# Patient Record
Sex: Male | Born: 1960 | Race: White | Hispanic: No | Marital: Married | Smoking: Never smoker
Health system: Southern US, Community
[De-identification: ages and names within clinical notes are randomized; demographics above are authoritative.]

## PROBLEM LIST (undated history)

## (undated) DIAGNOSIS — Z7901 Long term (current) use of anticoagulants: Secondary | ICD-10-CM

## (undated) DIAGNOSIS — G4733 Obstructive sleep apnea (adult) (pediatric): Secondary | ICD-10-CM

## (undated) DIAGNOSIS — I4891 Unspecified atrial fibrillation: Secondary | ICD-10-CM

## (undated) DIAGNOSIS — I2510 Atherosclerotic heart disease of native coronary artery without angina pectoris: Secondary | ICD-10-CM

## (undated) DIAGNOSIS — Z9989 Dependence on other enabling machines and devices: Secondary | ICD-10-CM

## (undated) DIAGNOSIS — I10 Essential (primary) hypertension: Secondary | ICD-10-CM

## (undated) DIAGNOSIS — M109 Gout, unspecified: Secondary | ICD-10-CM

## (undated) DIAGNOSIS — E785 Hyperlipidemia, unspecified: Secondary | ICD-10-CM

## (undated) DIAGNOSIS — Z8249 Family history of ischemic heart disease and other diseases of the circulatory system: Secondary | ICD-10-CM

## (undated) DIAGNOSIS — K219 Gastro-esophageal reflux disease without esophagitis: Secondary | ICD-10-CM

## (undated) HISTORY — DX: Morbid (severe) obesity due to excess calories: E66.01

## (undated) HISTORY — DX: Hyperlipidemia, unspecified: E78.5

## (undated) HISTORY — DX: Atherosclerotic heart disease of native coronary artery without angina pectoris: I25.10

## (undated) HISTORY — DX: Essential (primary) hypertension: I10

## (undated) HISTORY — DX: Dependence on other enabling machines and devices: Z99.89

## (undated) HISTORY — DX: Unspecified atrial fibrillation: I48.91

## (undated) HISTORY — DX: Gastro-esophageal reflux disease without esophagitis: K21.9

## (undated) HISTORY — DX: Family history of ischemic heart disease and other diseases of the circulatory system: Z82.49

## (undated) HISTORY — DX: Obstructive sleep apnea (adult) (pediatric): G47.33

## (undated) HISTORY — DX: Long term (current) use of anticoagulants: Z79.01

## (undated) HISTORY — DX: Gout, unspecified: M10.9

---

## 1988-09-03 HISTORY — PX: SEPTOPLASTY: SUR1290

## 2013-09-03 HISTORY — PX: TONSILLECTOMY: SUR1361

## 2016-09-03 DIAGNOSIS — Z87442 Personal history of urinary calculi: Secondary | ICD-10-CM

## 2016-09-03 HISTORY — PX: CYSTOSCOPY WITH HOLMIUM LASER LITHOTRIPSY: SHX6639

## 2016-09-03 HISTORY — DX: Personal history of urinary calculi: Z87.442

## 2018-06-30 HISTORY — PX: CARDIOVERSION: SHX1299

## 2018-08-04 NOTE — Progress Notes (Addendum)
Electrophysiology Office Note   Date:  08/05/2018   ID:  Austin Barker, DOB 06-Jun-1961, MRN [id number]  PCP:  Dr Katharina Caperraig Petry  Cardiologist:  Dr Georgana CurioJunpaparp Primary Electrophysiologist: Dr Elberta Fortisamnitz   CC: Evaluation of atrial fibrillation   History of Present Illness: Austin Barker is a [age] y.o. male with a history of OSA, HTN, hyperlipidemia, and obesity who is being seen today for the evaluation of paroxsymal atrial fibrillation referred by Dr. Collier BullockJundaparp. . Patient was first diagnosed with atrial fibrillation in September of this year at his PCP office. He was symptomatic with severe fatigue and exertional dyspnea. He was started on amiodarone and Xarelto and underwent DCCV on 06/19/18 which was successful. He continues to have paroxysms of afib and had another DCCV on 06/30/18. He stayed in normal rhythm for about a month and is now back in atrial fibrillation and has been for over a week. Patient has been on Xarelto since his diagnosis.   Today, he denies symptoms of palpitations, chest pain, orthopnea, PND, lower extremity edema, claudication, dizziness, presyncope, syncope, bleeding, or neurologic sequela. The patient is tolerating medications without difficulties.    Past Medical History:  Diagnosis Date  . A-fib (HCC)       . Dyslipidemia   . Essential hypertension, benign   . Family history of abdominal aortic aneurysm (AAA)   . FH: coronary artery disease   . GERD (gastroesophageal reflux disease)   . Gout, unspecified   . Long term (current) use of anticoagulants   . Morbid obesity (HCC)   . OSA on CPAP    Past Surgical History:  Procedure Laterality Date  . CARDIOVERSION  06/30/2018     Current Outpatient Medications  Medication Sig Dispense Refill  . amiodarone (PACERONE) 200 MG tablet Take 200 mg by mouth daily.    Marland Kitchen. atenolol (TENORMIN) 50 MG tablet Take 50 mg by mouth daily.    Marland Kitchen. atorvastatin (LIPITOR) 20 MG tablet Take 20 mg by mouth daily.    .  benazepril (LOTENSIN) 40 MG tablet Take 40 mg by mouth daily.    . cetirizine (ZYRTEC) 10 MG tablet Take 10 mg by mouth daily.    . Febuxostat (ULORIC) 80 MG TABS Take 80 mg by mouth daily.     . fenofibrate 160 MG tablet Take 160 mg by mouth daily.    . hydrochlorothiazide (HYDRODIURIL) 25 MG tablet Take 25 mg by mouth daily.    . Multiple Vitamins-Minerals (CENTRUM SILVER 50+MEN) TABS Take by mouth daily.     . Omega-3 Fatty Acids (FISH OIL) 1000 MG CAPS Take 2,000 mg by mouth daily.     Marland Kitchen. omeprazole (PRILOSEC) 20 MG capsule Take 20 mg by mouth daily.    . rivaroxaban (XARELTO) 20 MG TABS tablet Take 20 mg by mouth daily with supper.     No current facility-administered medications for this visit.     Allergies:   Oxycodone   Social History:  The patient  reports that he has never smoked. He has never used smokeless tobacco.   Family History:  The patient's family history includes Diabetes in his father; Heart attack (age of onset: 2740) in his brother.    ROS:  Please see the history of present illness.   Otherwise, review of systems is positive for shortness of breath and fatigue.   All other systems are reviewed and negative.    PHYSICAL EXAM: VS:  BP 110/76   Pulse 90   Ht 6'  1" (1.854 m)   Wt (!) 314 lb 6.4 oz (142.6 kg)   SpO2 99%   BMI 41.48 kg/m  , BMI Body mass index is 41.48 kg/m. GEN: Well nourished, well developed obese male in no acute distress  HEENT: normal  Neck: no JVD, carotid bruits, or masses Cardiac: irregularly irregular; no murmurs, rubs, or gallops,no edema  Respiratory:  clear to auscultation bilaterally, normal work of breathing GI: soft, nontender, nondistended, + BS MS: no deformity or atrophy  Skin: warm and dry Neuro:  Strength and sensation are intact Psych: euthymic mood, full affect  EKG:  EKG is ordered today. Personal review of the ekg ordered shows atrial fibrillation HR 90, QRS 90, QTc 459   Recent Labs: No results found for  requested labs within last 8760 hours.    Lipid Panel  No results found for: CHOL, TRIG, HDL, CHOLHDL, VLDL, LDLCALC, LDLDIRECT   Wt Readings from Last 3 Encounters:  08/05/18 (!) 314 lb 6.4 oz (142.6 kg)      Other studies Reviewed: Additional studies/ records that were reviewed today include: [facility] notes, DCCV report 06/30/18. Echo 06/16/18 Review of the above records today demonstrates:  Echo-LVEF 55-60%, mild LVH, mild MR, mild aortic root dilation (43mm), LA normal size.   ASSESSMENT AND PLAN:  1.  Persistant atrial fibrillation New onset 9/19. S/p DCCV x2 Patient has been on amiodarone 200mg  daily and atenolol 50mg  daily. He has failed AA therapy with amiodarone.   Chads2vasc score is 1 he is anticoagulated with xarelto 20mg  daily . Therapeutic strategies for afib including medicine and ablation were discussed in detail with the patient today. Risk, benefits, and alternatives to EP study and radiofrequency ablation for afib were also discussed in detail today. These risks include but are not limited to stroke, bleeding, vascular damage, tamponade, perforation, damage to the esophagus, lungs, and other structures, pulmonary vein stenosis, worsening renal function, and death. The patient understands these risk and wishes to proceed.  We Rashea Hoskie therefore proceed with catheter ablation at the next available time.  Continue amiodarone and atenolol for now.  2. OSA Patient reports good compliance with CPAP. He is due to have his machine checked.  3. HTN Stable, no changes today  4.  Morbid obesity Body mass index is 41.48 kg/m. Lifestyle modifications discussed. Diet and exercise recommended.   Current medicines are reviewed at length with the patient today.   The patient does not have concerns regarding his medicines.  The following changes were made today:  none  Labs/ tests ordered today include: Orders Placed This Encounter  Procedures  . CT  CARDIAC MORPH/PULM VEIN W/CM&W/O CA SCORE  . CT CORONARY FRACTIONAL FLOW RESERVE DATA PREP  . CT CORONARY FRACTIONAL FLOW RESERVE FLUID ANALYSIS  . EKG 12-Lead     Disposition:   Claxton Levitz schedule FU after his ablation procedure.  Signed, Vayla Wilhelmi Jorja Loa, MD  08/05/2018 10:20 AM     [facility] [address] [place] [address] [phone] (office) [phone] (fax)  I have seen and examined this patient with Jorja Loa.  Agree with above, note added to reflect my findings.  On exam, iRRR, no murmurs, lungs clear.  Patient with persistent atrial fibrillation.  Is currently on amiodarone and has had cardioversion x2 with early return of atrial fibrillation.  At this point he is failing medical therapy.  We Kaiya Boatman plan for ablation.  Risks and benefits were discussed and include bleeding, tamponade, heart block,  stroke, damage to surrounding organs.  He understands these risks and is agreed to the procedure.  I did encourage him to be compliant with his CPAP.  He also has morbid obesity.  Encouraged weight loss and exercise.  Case discussed with referring cardiologist  Damesha Lawler M. Marykathryn Carboni MD 08/05/2018 10:20 AM

## 2018-08-05 ENCOUNTER — Encounter: Payer: Self-pay | Admitting: Cardiology

## 2018-08-05 ENCOUNTER — Ambulatory Visit: Payer: BLUE CROSS/BLUE SHIELD | Admitting: Cardiology

## 2018-08-05 VITALS — BP 110/76 | HR 90 | Ht 73.0 in | Wt 314.4 lb

## 2018-08-05 DIAGNOSIS — I4819 Other persistent atrial fibrillation: Secondary | ICD-10-CM

## 2018-08-05 DIAGNOSIS — I2510 Atherosclerotic heart disease of native coronary artery without angina pectoris: Secondary | ICD-10-CM

## 2018-08-05 DIAGNOSIS — I10 Essential (primary) hypertension: Secondary | ICD-10-CM | POA: Diagnosis not present

## 2018-08-05 DIAGNOSIS — G4733 Obstructive sleep apnea (adult) (pediatric): Secondary | ICD-10-CM | POA: Diagnosis not present

## 2018-08-05 NOTE — Patient Instructions (Addendum)
Medication Instructions:  Your physician recommends that you continue on your current medications as directed. Please refer to the Current Medication list given to you today.  *If you need a refill on your cardiac medications before your next appointment, please call your pharmacy.  Labwork: We will arrange for you to have lab work at your primary doctor in [place].  Testing/Procedures: Your physician has requested that you have cardiac CT within 7 days PRIOR to your ablation. Cardiac computed tomography (CT) is a painless test that uses an x-ray machine to take clear, detailed pictures of your heart. For further information please visit [email or web address] Please follow instruction below located under special instructions.  Your physician has recommended that you have an ablation. Catheter ablation is a medical procedure used to treat some cardiac arrhythmias (irregular heartbeats). During catheter ablation, a long, thin, flexible tube is put into a blood vessel in your groin (upper thigh), or neck. This tube is called an ablation catheter. It is then guided to your heart through the blood vessel. Radio frequency waves destroy small areas of heart tissue where abnormal heartbeats may cause an arrhythmia to start. Please see the instructions below located under special instructions  Follow-Up: Your physician recommends that you schedule a follow-up appointment in: 4 weeks, after your procedure on 09/12/2018, with Rudi Coco NP in the AFib clinic.  Your physician recommends that you schedule a follow up appointment in: 3 months, after your procedure on 09/12/2018, with Dr. Elberta Fortis.  *Please note that any paperwork needing to be filled out by the provider will need to be addressed at the front desk prior to seeing the provider. Please note that any FMLA, disability or other documents regarding health condition is subject to a $25.00 charge that must be received prior to completion of paperwork in  the form of a money order or check.  Thank you for choosing [facility]!! [phone]   Any Other Special Instructions Will Be Listed Below    CT INSTRUCTIONS Please arrive at the [facility] main entrance of [facility] at _____ AM (30-45 minutes prior to test start time)  [facility] [address] [place], [place] [address] [phone]  Proceed to the [facility] Radiology Department (First Floor).  Please follow these instructions carefully (unless otherwise directed):  Hold all erectile dysfunction medications at least 48 hours prior to test.  On the Night Before the Test: . Be sure to Drink plenty of water. . Do not consume any caffeinated/decaffeinated beverages or chocolate 12 hours prior to your test. . Do not take any antihistamines 12 hours prior to your test.  On the Day of the Test: . Drink plenty of water. Do not drink any water within one hour of the test. . Do not eat any food 4 hours prior to the test. . You may take your regular medications prior to the test.  . Take your ATENOLOL two hours prior to test. . HOLD Hydrochlorothiazide the morning of the test.      After the Test: . Drink plenty of water. . After receiving IV contrast, you may experience a mild flushed feeling. This is normal. . On occasion, you may experience a mild rash up to 24 hours after the test. This is not dangerous. If this occurs, you can take Benadryl 25 mg and increase your fluid intake. . If you experience trouble breathing, this can be serious. If it is severe call 911 IMMEDIATELY. If it is mild, please call  our office.    Instructions for your ablation: 1. Please arrive [facility], Main Entrance "A", [facility] at 8:30 A.M. on 09/12/2018. 2. Do not eat or drink after midnight the night prior to the procedure. 3. Do not miss any doses of XARELTO prior to the morning of the procedure.  4. Do not take any medications the morning of  the procedure. 5. Plan for an overnight stay in the hospital. 6. You will need someone to drive you home at discharge.   Cardiac Ablation Cardiac ablation is a procedure to disable (ablate) a small amount of heart tissue in very specific places. The heart has many electrical connections. Sometimes these connections are abnormal and can cause the heart to beat very fast or irregularly. Ablating some of the problem areas can improve the heart rhythm or return it to normal. Ablation may be done for people who:  Have Wolff-Parkinson-White syndrome.  Have fast heart rhythms (tachycardia).  Have taken medicines for an abnormal heart rhythm (arrhythmia) that were not effective or caused side effects.  Have a high-risk heartbeat that may be life-threatening.  During the procedure, a small incision is made in the neck or the groin, and a long, thin, flexible tube (catheter) is inserted into the incision and moved to the heart. Small devices (electrodes) on the tip of the catheter will send out electrical currents. A type of X-ray (fluoroscopy) will be used to help guide the catheter and to provide images of the heart. Tell a health care provider about:  Any allergies you have.  All medicines you are taking, including vitamins, herbs, eye drops, creams, and over-the-counter medicines.  Any problems you or family members have had with anesthetic medicines.  Any blood disorders you have.  Any surgeries you have had.  Any medical conditions you have, such as kidney failure.  Whether you are pregnant or may be pregnant. What are the risks? Generally, this is a safe procedure. However, problems may occur, including:  Infection.  Bruising and bleeding at the catheter insertion site.  Bleeding into the chest, especially into the sac that surrounds the heart. This is a serious complication.  Stroke or blood clots.  Damage to other structures or organs.  Allergic reaction to medicines or  dyes.  Need for a permanent pacemaker if the normal electrical system is damaged. A pacemaker is a small computer that sends electrical signals to the heart and helps your heart beat normally.  The procedure not being fully effective. This may not be recognized until months later. Repeat ablation procedures are sometimes required.  What happens before the procedure?  Follow instructions from your health care provider about eating or drinking restrictions.  Ask your health care provider about: ? Changing or stopping your regular medicines. This is especially important if you are taking diabetes medicines or blood thinners. ? Taking medicines such as aspirin and ibuprofen. These medicines can thin your blood. Do not take these medicines before your procedure if your health care provider instructs you not to.  Plan to have someone take you home from the hospital or clinic.  If you will be going home right after the procedure, plan to have someone with you for 24 hours. What happens during the procedure?  To lower your risk of infection: ? Your health care team will wash or sanitize their hands. ? Your skin will be washed with soap. ? Hair may be removed from the incision area.  An IV tube  will be inserted into one of your veins.  You will be given a medicine to help you relax (sedative).  The skin on your neck or groin will be numbed.  An incision will be made in your neck or your groin.  A needle will be inserted through the incision and into a large vein in your neck or groin.  A catheter will be inserted into the needle and moved to your heart.  Dye may be injected through the catheter to help your surgeon see the area of the heart that needs treatment.  Electrical currents will be sent from the catheter to ablate heart tissue in desired areas. There are three types of energy that may be used to ablate heart tissue: ? Heat (radiofrequency energy). ? Laser energy. ? Extreme  cold (cryoablation).  When the necessary tissue has been ablated, the catheter will be removed.  Pressure will be held on the catheter insertion area to prevent excessive bleeding.  A bandage (dressing) will be placed over the catheter insertion area. The procedure may vary among health care providers and hospitals. What happens after the procedure?  Your blood pressure, heart rate, breathing rate, and blood oxygen level will be monitored until the medicines you were given have worn off.  Your catheter insertion area will be monitored for bleeding. You will need to lie still for a few hours to ensure that you do not bleed from the catheter insertion area.  Do not drive for 24 hours or as long as directed by your health care provider. Summary  Cardiac ablation is a procedure to disable (ablate) a small amount of heart tissue in very specific places. Ablating some of the problem areas can improve the heart rhythm or return it to normal.  During the procedure, electrical currents will be sent from the catheter to ablate heart tissue in desired areas. This information is not intended to replace advice given to you by your health care provider. Make sure you discuss any questions you have with your health care provider. Document Released: 01/06/2009 Document Revised: 07/09/2016 Document Reviewed: 07/09/2016 Elsevier Interactive Patient Education  [employer].

## 2018-08-05 NOTE — Addendum Note (Signed)
Addended by: Dory HornPRICE, Haley Roza L on: 08/05/2018 10:00 AM   Modules accepted: Orders

## 2018-08-05 NOTE — Progress Notes (Signed)
Completed OV faxed to both [place]CP and [place] in [place].   (Dr. Theodosia [facility]alingPetry at [facility])  (Dr. Durel SaltsValentine at [facility])

## 2018-09-04 ENCOUNTER — Telehealth (HOSPITAL_COMMUNITY): Payer: Self-pay | Admitting: Emergency Medicine

## 2018-09-04 NOTE — Telephone Encounter (Signed)
Reaching out to patient to offer assistance regarding upcoming cardiac imaging study; pt verbalizes understanding of appt date/time, parking situation and where to check in, pre-test NPO status and medications ordered, and verified current allergies; name and call back number provided for further questions should they arise Magda Muise RN Navigator Cardiac Imaging [phone] 

## 2018-09-08 ENCOUNTER — Ambulatory Visit (HOSPITAL_COMMUNITY): Admission: RE | Admit: 2018-09-08 | Payer: BLUE CROSS/BLUE SHIELD | Source: Ambulatory Visit

## 2018-09-08 ENCOUNTER — Encounter (HOSPITAL_COMMUNITY): Payer: Self-pay

## 2018-09-08 ENCOUNTER — Ambulatory Visit (HOSPITAL_COMMUNITY)
Admission: RE | Admit: 2018-09-08 | Discharge: 2018-09-08 | Disposition: A | Discharge status: Home / Self Care | Payer: BLUE CROSS/BLUE SHIELD | Source: Ambulatory Visit | Attending: Cardiology | Admitting: Cardiology

## 2018-09-08 DIAGNOSIS — I4819 Other persistent atrial fibrillation: Secondary | ICD-10-CM

## 2018-09-08 MED ORDER — IOPAMIDOL (ISOVUE-370) INJECTION 76%
80.0000 mL | Freq: Once | INTRAVENOUS | Status: AC | PRN
  Administered 2018-09-08: 100 mL via INTRAVENOUS

## 2018-09-10 ENCOUNTER — Telehealth: Payer: Self-pay | Admitting: Cardiology

## 2018-09-10 DIAGNOSIS — R931 Abnormal findings on diagnostic imaging of heart and coronary circulation: Secondary | ICD-10-CM

## 2018-09-10 NOTE — Telephone Encounter (Signed)
Informed patient of results and verbal understanding expressed.  Pt understands office will contact him to arrange myoview testing.

## 2018-09-10 NOTE — Telephone Encounter (Signed)
-----   Message from Will Jorja Loa, MD sent at 09/09/2018 12:18 PM EST ----- Will need myoview to assess CAD with high calcium score seen on CT. Can be done post ablation. CT with out LAA thrombus reviewed.

## 2018-09-10 NOTE — Telephone Encounter (Signed)
New Message    Patient had CT done on 09/08/18 and wants the results, because he's having an ablation on Friday.

## 2018-09-11 NOTE — Anesthesia Preprocedure Evaluation (Addendum)
Anesthesia Evaluation  Patient identified by MRN, date of birth, ID band Patient awake    Reviewed: Allergy & Precautions, NPO status , Patient's Chart, lab work & pertinent test results, reviewed documented beta blocker date and time   History of Anesthesia Complications Negative for: history of anesthetic complications  Airway Mallampati: II  TM Distance: >3 FB Neck ROM: Full    Dental  (+) Dental Advisory Given   Pulmonary sleep apnea and Continuous Positive Airway Pressure Ventilation ,    breath sounds clear to auscultation       Cardiovascular hypertension, Pt. on medications and Pt. on home beta blockers (-) angina+ dysrhythmias Atrial Fibrillation  Rhythm:Regular Rate:Normal  10/19 ECHO: EF 55-60%, mild MR   Neuro/Psych negative neurological ROS     GI/Hepatic Neg liver ROS, GERD  Controlled,  Endo/Other  Morbid obesity  Renal/GU negative Renal ROS     Musculoskeletal   Abdominal (+) + obese,   Peds  Hematology xarelto   Anesthesia Other Findings   Reproductive/Obstetrics                            Anesthesia Physical Anesthesia Plan  ASA: III  Anesthesia Plan: General   Post-op Pain Management:    Induction: Intravenous  PONV Risk Score and Plan: 2 and Ondansetron and Dexamethasone  Airway Management Planned: Oral ETT  Additional Equipment:   Intra-op Plan:   Post-operative Plan: Extubation in OR  Informed Consent: I have reviewed the patients History and Physical, chart, labs and discussed the procedure including the risks, benefits and alternatives for the proposed anesthesia with the patient or authorized representative who has indicated his/her understanding and acceptance.   Dental advisory given  Plan Discussed with: CRNA and Surgeon  Anesthesia Plan Comments: (Plan routine monitors, GETA)        Anesthesia Quick Evaluation

## 2018-09-12 ENCOUNTER — Other Ambulatory Visit: Payer: Self-pay

## 2018-09-12 ENCOUNTER — Encounter (HOSPITAL_COMMUNITY)
Admission: RE | Disposition: A | Discharge status: Home / Self Care | Payer: Self-pay | Source: Home / Self Care | Attending: Cardiology

## 2018-09-12 ENCOUNTER — Ambulatory Visit (HOSPITAL_COMMUNITY): Payer: BLUE CROSS/BLUE SHIELD | Admitting: Certified Registered Nurse Anesthetist

## 2018-09-12 ENCOUNTER — Ambulatory Visit (HOSPITAL_COMMUNITY)
Admission: RE | Admit: 2018-09-12 | Discharge: 2018-09-12 | Disposition: A | Discharge status: Home / Self Care | Payer: BLUE CROSS/BLUE SHIELD | Attending: Cardiology | Admitting: Cardiology

## 2018-09-12 DIAGNOSIS — I4819 Other persistent atrial fibrillation: Secondary | ICD-10-CM | POA: Diagnosis present

## 2018-09-12 DIAGNOSIS — Z6841 Body Mass Index (BMI) 40.0 and over, adult: Secondary | ICD-10-CM | POA: Diagnosis not present

## 2018-09-12 DIAGNOSIS — K219 Gastro-esophageal reflux disease without esophagitis: Secondary | ICD-10-CM | POA: Diagnosis not present

## 2018-09-12 DIAGNOSIS — Z8249 Family history of ischemic heart disease and other diseases of the circulatory system: Secondary | ICD-10-CM | POA: Insufficient documentation

## 2018-09-12 DIAGNOSIS — G4733 Obstructive sleep apnea (adult) (pediatric): Secondary | ICD-10-CM | POA: Insufficient documentation

## 2018-09-12 DIAGNOSIS — Z885 Allergy status to narcotic agent status: Secondary | ICD-10-CM | POA: Insufficient documentation

## 2018-09-12 DIAGNOSIS — Z79899 Other long term (current) drug therapy: Secondary | ICD-10-CM | POA: Insufficient documentation

## 2018-09-12 DIAGNOSIS — E785 Hyperlipidemia, unspecified: Secondary | ICD-10-CM | POA: Diagnosis not present

## 2018-09-12 DIAGNOSIS — I10 Essential (primary) hypertension: Secondary | ICD-10-CM | POA: Insufficient documentation

## 2018-09-12 DIAGNOSIS — I480 Paroxysmal atrial fibrillation: Secondary | ICD-10-CM | POA: Diagnosis not present

## 2018-09-12 DIAGNOSIS — Z7901 Long term (current) use of anticoagulants: Secondary | ICD-10-CM | POA: Insufficient documentation

## 2018-09-12 HISTORY — PX: ATRIAL FIBRILLATION ABLATION: EP1191

## 2018-09-12 LAB — POCT ACTIVATED CLOTTING TIME
Activated Clotting Time: 290 seconds
Activated Clotting Time: 312 seconds
Activated Clotting Time: 296 seconds
Activated Clotting Time: 142 seconds

## 2018-09-12 LAB — BASIC METABOLIC PANEL
Anion gap: 12 (ref 5–15)
BUN: 19 mg/dL (ref 6–20)
CALCIUM: 9.2 mg/dL (ref 8.9–10.3)
CO2: 25 mmol/L (ref 22–32)
Chloride: 102 mmol/L (ref 98–111)
Creatinine, Ser: 1.51 mg/dL — ABNORMAL HIGH (ref 0.61–1.24)
GFR calc Af Amer: 58 mL/min — ABNORMAL LOW (ref >60)
GFR calc non Af Amer: 50 mL/min — ABNORMAL LOW (ref >60)
Glucose, Bld: 125 mg/dL — ABNORMAL HIGH (ref 70–99)
Potassium: 3.9 mmol/L (ref 3.5–5.1)
Sodium: 139 mmol/L (ref 135–145)

## 2018-09-12 LAB — CBC
HCT: 42.1 % (ref 39.0–52.0)
Hemoglobin: 14.4 g/dL (ref 13.0–17.0)
MCH: 31.4 pg (ref 26.0–34.0)
MCHC: 34.2 g/dL (ref 30.0–36.0)
MCV: 91.7 fL (ref 80.0–100.0)
Platelets: 184 10*3/uL (ref 150–400)
RBC: 4.59 MIL/uL (ref 4.22–5.81)
RDW: 12.7 % (ref 11.5–15.5)
WBC: 7 10*3/uL (ref 4.0–10.5)
nRBC: 0 % (ref 0.0–0.2)

## 2018-09-12 SURGERY — ATRIAL FIBRILLATION ABLATION
Anesthesia: General

## 2018-09-12 MED ORDER — PROTAMINE SULFATE 10 MG/ML IV SOLN
INTRAVENOUS | Status: DC | PRN
  Administered 2018-09-12: 50 mg via INTRAVENOUS

## 2018-09-12 MED ORDER — PROPOFOL 10 MG/ML IV BOLUS
INTRAVENOUS | Status: DC | PRN
  Administered 2018-09-12: 100 mg via INTRAVENOUS
  Administered 2018-09-12: 200 mg via INTRAVENOUS

## 2018-09-12 MED ORDER — DOBUTAMINE IN D5W 4-5 MG/ML-% IV SOLN
INTRAVENOUS | Status: DC | PRN
  Administered 2018-09-12: 20 ug/kg/min via INTRAVENOUS

## 2018-09-12 MED ORDER — FENTANYL CITRATE (PF) 100 MCG/2ML IJ SOLN
INTRAMUSCULAR | Status: DC | PRN
  Administered 2018-09-12 (×4): 25 ug via INTRAVENOUS

## 2018-09-12 MED ORDER — LIDOCAINE 2% (20 MG/ML) 5 ML SYRINGE
INTRAMUSCULAR | Status: DC | PRN
  Administered 2018-09-12: 40 mg via INTRAVENOUS
  Administered 2018-09-12: 60 mg via INTRAVENOUS

## 2018-09-12 MED ORDER — SODIUM CHLORIDE 0.9% FLUSH: 3.0000 mL | INTRAVENOUS | Status: DC | PRN

## 2018-09-12 MED ORDER — HEPARIN SODIUM (PORCINE) 1000 UNIT/ML IJ SOLN
INTRAMUSCULAR | Status: AC
  Filled 2018-09-12: qty 1

## 2018-09-12 MED ORDER — SODIUM CHLORIDE 0.9 % IV SOLN: 250.0000 mL | INTRAVENOUS | Status: DC | PRN

## 2018-09-12 MED ORDER — DEXAMETHASONE SODIUM PHOSPHATE 10 MG/ML IJ SOLN
INTRAMUSCULAR | Status: DC | PRN
  Administered 2018-09-12: 8 mg via INTRAVENOUS

## 2018-09-12 MED ORDER — ACETAMINOPHEN 325 MG PO TABS
650.0000 mg | ORAL_TABLET | ORAL | Status: DC | PRN
  Filled 2018-09-12: qty 2

## 2018-09-12 MED ORDER — HEPARIN (PORCINE) IN NACL 1000-0.9 UT/500ML-% IV SOLN
INTRAVENOUS | Status: AC
  Filled 2018-09-12: qty 500

## 2018-09-12 MED ORDER — SODIUM CHLORIDE 0.9 % IV SOLN
INTRAVENOUS | Status: DC | PRN
  Administered 2018-09-12: 07:00:00 via INTRAVENOUS

## 2018-09-12 MED ORDER — HEPARIN SODIUM (PORCINE) 1000 UNIT/ML IJ SOLN
INTRAMUSCULAR | Status: DC | PRN
  Administered 2018-09-12: 1000 [IU] via INTRAVENOUS

## 2018-09-12 MED ORDER — SODIUM CHLORIDE 0.9 % IV SOLN
INTRAVENOUS | Status: DC
  Administered 2018-09-12: 07:00:00 via INTRAVENOUS

## 2018-09-12 MED ORDER — HEPARIN (PORCINE) IN NACL 1000-0.9 UT/500ML-% IV SOLN
INTRAVENOUS | Status: DC | PRN
  Administered 2018-09-12 (×5): 500 mL

## 2018-09-12 MED ORDER — SODIUM CHLORIDE 0.9% FLUSH: 3.0000 mL | Freq: Two times a day (BID) | INTRAVENOUS | Status: DC

## 2018-09-12 MED ORDER — SUGAMMADEX SODIUM 200 MG/2ML IV SOLN
INTRAVENOUS | Status: DC | PRN
  Administered 2018-09-12: 283 mg via INTRAVENOUS

## 2018-09-12 MED ORDER — ROCURONIUM BROMIDE 10 MG/ML (PF) SYRINGE
PREFILLED_SYRINGE | INTRAVENOUS | Status: DC | PRN
  Administered 2018-09-12 (×2): 20 mg via INTRAVENOUS
  Administered 2018-09-12: 100 mg via INTRAVENOUS

## 2018-09-12 MED ORDER — ONDANSETRON HCL 4 MG/2ML IJ SOLN: 4.0000 mg | Freq: Four times a day (QID) | INTRAMUSCULAR | Status: DC | PRN

## 2018-09-12 MED ORDER — DOBUTAMINE IN D5W 4-5 MG/ML-% IV SOLN
INTRAVENOUS | Status: AC
  Filled 2018-09-12: qty 250

## 2018-09-12 MED ORDER — BUPIVACAINE HCL (PF) 0.25 % IJ SOLN
INTRAMUSCULAR | Status: DC | PRN
  Administered 2018-09-12: 60 mL

## 2018-09-12 MED ORDER — MIDAZOLAM HCL 5 MG/5ML IJ SOLN
INTRAMUSCULAR | Status: DC | PRN
  Administered 2018-09-12: 2 mg via INTRAVENOUS

## 2018-09-12 MED ORDER — EPHEDRINE SULFATE-NACL 50-0.9 MG/10ML-% IV SOSY
PREFILLED_SYRINGE | INTRAVENOUS | Status: DC | PRN
  Administered 2018-09-12: 5 mg via INTRAVENOUS

## 2018-09-12 MED ORDER — HEPARIN SODIUM (PORCINE) 1000 UNIT/ML IJ SOLN
INTRAMUSCULAR | Status: DC | PRN
  Administered 2018-09-12: 4000 [IU] via INTRAVENOUS
  Administered 2018-09-12: 16000 [IU] via INTRAVENOUS
  Administered 2018-09-12: 2000 [IU] via INTRAVENOUS

## 2018-09-12 MED ORDER — SODIUM CHLORIDE 0.9 % IV SOLN
INTRAVENOUS | Status: DC | PRN
  Administered 2018-09-12: 20 ug/min via INTRAVENOUS

## 2018-09-12 MED ORDER — BUPIVACAINE HCL (PF) 0.25 % IJ SOLN
INTRAMUSCULAR | Status: AC
  Filled 2018-09-12: qty 60

## 2018-09-12 SURGICAL SUPPLY — 20 items
BLANKET WARM UNDERBOD FULL ACC (MISCELLANEOUS) ×1 IMPLANT
CATH MAPPNG PENTARAY F 2-6-2MM (CATHETERS) IMPLANT
CATH SMTCH THERMOCOOL SF DF (CATHETERS) ×1 IMPLANT
CATH SOUNDSTAR 3D IMAGING (CATHETERS) ×1 IMPLANT
CATH WEBSTER BI DIR CS D-F CRV (CATHETERS) ×1 IMPLANT
COVER SWIFTLINK CONNECTOR (BAG) ×1 IMPLANT
HOVERMATT SINGLE USE (MISCELLANEOUS) ×1 IMPLANT
PACK EP LATEX FREE (CUSTOM PROCEDURE TRAY) ×1
PACK EP LF (CUSTOM PROCEDURE TRAY) ×1 IMPLANT
PAD PRO RADIOLUCENT 2001M-C (PAD) ×2 IMPLANT
PATCH CARTO3 (PAD) ×1 IMPLANT
PENTARAY F 2-6-2MM (CATHETERS) ×2
SHEATH AVANTI 11F 11CM (SHEATH) ×1 IMPLANT
SHEATH BAYLIS SUREFLEX  M 8.5 (SHEATH) ×1
SHEATH BAYLIS SUREFLEX M 8.5 (SHEATH) IMPLANT
SHEATH BAYLIS TRANSSEPTAL 98CM (NEEDLE) ×1 IMPLANT
SHEATH PINNACLE 7F 10CM (SHEATH) ×1 IMPLANT
SHEATH PINNACLE 8F 10CM (SHEATH) ×2 IMPLANT
SHEATH PINNACLE 9F 10CM (SHEATH) ×2 IMPLANT
TUBING SMART ABLATE COOLFLOW (TUBING) ×1 IMPLANT

## 2018-09-12 NOTE — Discharge Instructions (Signed)
Post procedure care instructions No driving for 4 days No lifting over 5 lbs for 1 week. No vigorous or sexual activity for 1 week. You may return to work on 09/19/2018. Keep procedure site clean & dry. If you notice increased pain, swelling, bleeding or pus, call/return!  You may shower, but no soaking baths/hot tubs/pools for 1 week.

## 2018-09-12 NOTE — Progress Notes (Addendum)
Site area: Left groin a 7, and 11 french venous sheath was removed  Site Prior to Removal:  Level 0  Pressure Applied For 20 MINUTES    Bedrest Beginning at 1299p  Manual:   Yes.    Patient Status During Pull:  stable  Post Pull Groin Site:  Level 0  Post Pull Instructions Given:  Yes.    Post Pull Pulses Present:  Yes.    Dressing Applied:  Yes.    Comments:  VS remain stable

## 2018-09-12 NOTE — H&P (Signed)
Electrophysiology Office Note   Date:  09/12/2018   ID:  Austin FiremanStephen G Barker, DOB Jul 16, 1961, MRN [id number]  PCP:  Dr Austin Barker  Cardiologist:  Dr Austin Barker Primary Electrophysiologist: Dr Austin Barker   CC: Evaluation of atrial fibrillation   History of Present Illness: Verita SchneidersStephen G Barker is a 58 y.o. male with a history of OSA, HTN, hyperlipidemia, and obesity who is being seen today for the evaluation of paroxsymal atrial fibrillation referred by Dr. Collier Barker. . Patient was first diagnosed with atrial fibrillation in September of this year at his PCP office. He was symptomatic with severe fatigue and exertional dyspnea. He was started on amiodarone and Xarelto and underwent DCCV on 06/19/18 which was successful. He continues to have paroxysms of afib and had another DCCV on 06/30/18. He stayed in normal rhythm for about a month and is now back in atrial fibrillation and has been for over a week. Patient has been on Xarelto since his diagnosis.  Today, denies symptoms of palpitations, chest pain, shortness of breath, orthopnea, PND, lower extremity edema, claudication, dizziness, presyncope, syncope, bleeding, or neurologic sequela. The patient is tolerating medications without difficulties. Plan for ablation today.    Past Medical History:  Diagnosis Date  . A-fib (HCC)       . Dyslipidemia   . Essential hypertension, benign   . Family history of abdominal aortic aneurysm (AAA)   . FH: coronary artery disease   . GERD (gastroesophageal reflux disease)   . Gout, unspecified   . Long term (current) use of anticoagulants   . Morbid obesity (HCC)   . OSA on CPAP    Past Surgical History:  Procedure Laterality Date  . CARDIOVERSION  06/30/2018     Current Facility-Administered Medications  Medication Dose Route Frequency Provider Last Rate Last Dose  . 0.9 %  sodium chloride infusion   Intravenous Continuous Austin Barker, Austin Volpe Martin, MD 50 mL/hr at 09/12/18 [id number]      Allergies:    Oxycodone   Social History:  The patient  reports that he has never smoked. He has never used smokeless tobacco.   Family History:  The patient's family history includes Diabetes in his father; Heart attack (age of onset: 3440) in his brother.    ROS:  Please see the history of present illness.   Otherwise, review of systems is positive for SOB, fatigue.   All other systems are reviewed and negative.    GEN: Well nourished, well developed, in no acute distress  HEENT: normal  Neck: no JVD, carotid bruits, or masses Cardiac: RRR; no murmurs, rubs, or gallops,no edema  Respiratory:  clear to auscultation bilaterally, normal work of breathing GI: soft, nontender, nondistended, + BS MS: no deformity or atrophy  Skin: warm and dry Neuro:  Strength and sensation are intact Psych: euthymic mood, full affect   EKG:  EKG is ordered today. Personal review of the ekg ordered shows atrial fibrillation HR 90, QRS 90, QTc 459   Recent Labs: No results found for requested labs within last 8760 hours.    Lipid Panel  No results found for: CHOL, TRIG, HDL, CHOLHDL, VLDL, LDLCALC, LDLDIRECT   Wt Readings from Last 3 Encounters:  09/12/18 (!) 141.5 kg  08/05/18 (!) 142.6 kg      Other studies Reviewed: Additional studies/ records that were reviewed today include: [facility] notes, DCCV report 06/30/18. Echo 06/16/18 Review of the above records today demonstrates:  Echo-LVEF 55-60%, mild LVH, mild MR, mild aortic root  dilation (31mm), LA normal size.   ASSESSMENT AND PLAN:  1.  Persistant atrial fibrillation  Austin Barker has presented today for surgery, with the diagnosis of atrial fibrillation.  The various methods of treatment have been discussed with the patient and family. After consideration of risks, benefits and other options for treatment, the patient has consented to  Procedure(s): Catheter ablation as a surgical intervention .  Risks include but not limited  to bleeding, tamponade, heart block, stroke, damage to surrounding organs, among others. The patient's history has been reviewed, patient examined, no change in status, stable for surgery.  I have reviewed the patient's chart and labs.  Questions were answered to the patient's satisfaction.    2. OSA Patient reports good compliance with CPAP. He is due to have his machine checked.  3. HTN Stable, no changes today  4.  Morbid obesity Body mass index is 41.16 kg/m. Lifestyle modifications discussed. Diet and exercise recommended.  [name] M. Austin Culpepper MD 09/12/2018 7:01 AM

## 2018-09-12 NOTE — Anesthesia Procedure Notes (Signed)
Procedure Name: Intubation Performed by: Jairo Ben, MD Pre-anesthesia Checklist: Patient identified, Emergency Drugs available, Suction available and Patient being monitored Patient Re-evaluated:Patient Re-evaluated prior to induction Oxygen Delivery Method: Circle System Utilized Preoxygenation: Pre-oxygenation with 100% oxygen Induction Type: IV induction Ventilation: Mask ventilation without difficulty and Oral airway inserted - appropriate to patient size Grade View: Grade II Tube type: Oral Tube size: 7.5 mm Number of attempts: 2 Airway Equipment and Method: Stylet and Oral airway Placement Confirmation: ETT inserted through vocal cords under direct vision,  positive ETCO2 and breath sounds checked- equal and bilateral Secured at: 23 cm Tube secured with: Tape Dental Injury: Teeth and Oropharynx as per pre-operative assessment

## 2018-09-12 NOTE — Progress Notes (Addendum)
Site area: Right groin a 8 french sheath X2 was removed  Site Prior to Removal:  Level 0  Pressure Applied For 20 MINUTES    Bedrest Beginning at 1200p  Manual:   Yes.    Patient Status During Pull:  stable  Post Pull Groin Site:  Level 0  Post Pull Instructions Given:  Yes.    Post Pull Pulses Present:  Yes.    Dressing Applied:  Yes.    Comments:  VS remain stable

## 2018-09-12 NOTE — Anesthesia Postprocedure Evaluation (Signed)
Anesthesia Post Note  Patient: Austin Barker  Procedure(s) Performed: ATRIAL FIBRILLATION ABLATION (N/A )     Patient location during evaluation: Cath Lab Anesthesia Type: General Level of consciousness: awake and alert, patient cooperative and oriented Pain management: pain level controlled Vital Signs Assessment: post-procedure vital signs reviewed and stable Respiratory status: spontaneous breathing, nonlabored ventilation, respiratory function stable and patient connected to nasal cannula oxygen Cardiovascular status: blood pressure returned to baseline and stable Postop Assessment: no apparent nausea or vomiting Anesthetic complications: no    Last Vitals:  Vitals:   09/12/18 1116 09/12/18 1120  BP: (!) 113/51 112/68  Pulse: 73 72  Resp: 18 (!) 25  Temp:    SpO2: 97% 95%    Last Pain:  Vitals:   09/12/18 1037  TempSrc: Tympanic  PainSc: 0-No pain                 Kamea Dacosta,E. Annalie Wenner

## 2018-09-12 NOTE — Transfer of Care (Signed)
Immediate Anesthesia Transfer of Care Note  Patient: Austin Barker  Procedure(s) Performed: ATRIAL FIBRILLATION ABLATION (N/A )  Patient Location: PACU  Anesthesia Type:General  Level of Consciousness: awake and alert   Airway & Oxygen Therapy: Patient Spontanous Breathing and Patient connected to nasal cannula oxygen  Post-op Assessment: Report given to RN and Post -op Vital signs reviewed and stable  Post vital signs: Reviewed and stable  Last Vitals:  Vitals Value Taken Time  BP 103/60 09/12/2018 10:35 AM  Temp 36.2 C 09/12/2018 10:37 AM  Pulse 73 09/12/2018 10:37 AM  Resp 22 09/12/2018 10:37 AM  SpO2 93 % 09/12/2018 10:37 AM  Vitals shown include unvalidated device data.  Last Pain:  Vitals:   09/12/18 1037  TempSrc:   PainSc: 0-No pain         Complications: No apparent anesthesia complications

## 2018-09-15 ENCOUNTER — Encounter: Payer: Self-pay | Admitting: *Deleted

## 2018-09-15 ENCOUNTER — Telehealth: Payer: Self-pay | Admitting: Cardiology

## 2018-09-15 ENCOUNTER — Telehealth: Payer: Self-pay | Admitting: Internal Medicine

## 2018-09-15 ENCOUNTER — Encounter (HOSPITAL_COMMUNITY): Payer: Self-pay | Admitting: Cardiology

## 2018-09-15 NOTE — Telephone Encounter (Signed)
New Message:      Pt had  An Ablation on Friday. His question is when can he go back to work or can he work from home? He says he will need a note for way for either one please,

## 2018-09-15 NOTE — Telephone Encounter (Signed)
See MyChart message from today for more detail about this.

## 2018-09-15 NOTE — Telephone Encounter (Signed)
Follow up  Pt would prefer to speak on the telephone. Please call

## 2018-09-15 NOTE — Telephone Encounter (Signed)
Wrong provider

## 2018-09-24 ENCOUNTER — Telehealth (HOSPITAL_COMMUNITY): Payer: Self-pay | Admitting: *Deleted

## 2018-09-24 NOTE — Telephone Encounter (Signed)
Patient given detailed instructions per Myocardial Perfusion Study Information Sheet for the test on 09/29/18. Patient notified to arrive 15 minutes early and that it is imperative to arrive on time for appointment to keep from having the test rescheduled.  If you need to cancel or reschedule your appointment, please call the office within 24 hours of your appointment. . Patient verbalized understanding. Avri Paiva Jacqueline    

## 2018-09-29 ENCOUNTER — Ambulatory Visit (HOSPITAL_COMMUNITY): Payer: BLUE CROSS/BLUE SHIELD | Attending: Cardiovascular Disease

## 2018-09-29 DIAGNOSIS — R931 Abnormal findings on diagnostic imaging of heart and coronary circulation: Secondary | ICD-10-CM | POA: Diagnosis present

## 2018-09-29 MED ORDER — TECHNETIUM TC 99M TETROFOSMIN IV KIT
31.6000 | PACK | Freq: Once | INTRAVENOUS | Status: AC | PRN
  Administered 2018-09-29: 31.6 via INTRAVENOUS
  Filled 2018-09-29: qty 32

## 2018-09-29 MED ORDER — REGADENOSON 0.4 MG/5ML IV SOLN
0.4000 mg | Freq: Once | INTRAVENOUS | Status: AC
  Administered 2018-09-29: 0.4 mg via INTRAVENOUS

## 2018-09-30 ENCOUNTER — Ambulatory Visit (HOSPITAL_COMMUNITY): Payer: BLUE CROSS/BLUE SHIELD | Attending: Cardiovascular Disease

## 2018-09-30 LAB — MYOCARDIAL PERFUSION IMAGING
LV dias vol: 112 mL (ref 62–150)
LV sys vol: 44 mL
NUC STRESS TID: 1.06
Peak HR: 85 {beats}/min
Rest HR: 67 {beats}/min
SDS: 0
SRS: 0
SSS: 0

## 2018-09-30 MED ORDER — TECHNETIUM TC 99M TETROFOSMIN IV KIT
30.4000 | PACK | Freq: Once | INTRAVENOUS | Status: AC | PRN
  Administered 2018-09-30: 30.4 via INTRAVENOUS
  Filled 2018-09-30: qty 31

## 2018-10-02 ENCOUNTER — Telehealth: Payer: Self-pay | Admitting: *Deleted

## 2018-10-02 DIAGNOSIS — I2510 Atherosclerotic heart disease of native coronary artery without angina pectoris: Secondary | ICD-10-CM

## 2018-10-02 NOTE — Telephone Encounter (Signed)
-----   Message from Will Jorja Loa, MD sent at 10/02/2018 10:59 AM EST ----- Low risk myoview. Needs fasting lipids.

## 2018-10-10 ENCOUNTER — Encounter (HOSPITAL_COMMUNITY): Payer: Self-pay | Admitting: Nurse Practitioner

## 2018-10-10 ENCOUNTER — Ambulatory Visit (HOSPITAL_COMMUNITY)
Admission: RE | Admit: 2018-10-10 | Discharge: 2018-10-10 | Disposition: A | Discharge status: Home / Self Care | Payer: BLUE CROSS/BLUE SHIELD | Source: Ambulatory Visit | Attending: Nurse Practitioner | Admitting: Nurse Practitioner

## 2018-10-10 ENCOUNTER — Other Ambulatory Visit: Payer: BLUE CROSS/BLUE SHIELD | Admitting: *Deleted

## 2018-10-10 ENCOUNTER — Other Ambulatory Visit: Payer: Self-pay

## 2018-10-10 VITALS — BP 142/80 | HR 67 | Ht 73.0 in | Wt 321.4 lb

## 2018-10-10 DIAGNOSIS — G4733 Obstructive sleep apnea (adult) (pediatric): Secondary | ICD-10-CM | POA: Diagnosis not present

## 2018-10-10 DIAGNOSIS — E785 Hyperlipidemia, unspecified: Secondary | ICD-10-CM | POA: Insufficient documentation

## 2018-10-10 DIAGNOSIS — Z8249 Family history of ischemic heart disease and other diseases of the circulatory system: Secondary | ICD-10-CM | POA: Insufficient documentation

## 2018-10-10 DIAGNOSIS — Z6841 Body Mass Index (BMI) 40.0 and over, adult: Secondary | ICD-10-CM | POA: Diagnosis not present

## 2018-10-10 DIAGNOSIS — I480 Paroxysmal atrial fibrillation: Secondary | ICD-10-CM

## 2018-10-10 DIAGNOSIS — Z7901 Long term (current) use of anticoagulants: Secondary | ICD-10-CM | POA: Insufficient documentation

## 2018-10-10 DIAGNOSIS — E669 Obesity, unspecified: Secondary | ICD-10-CM | POA: Insufficient documentation

## 2018-10-10 DIAGNOSIS — K219 Gastro-esophageal reflux disease without esophagitis: Secondary | ICD-10-CM | POA: Insufficient documentation

## 2018-10-10 DIAGNOSIS — Z79899 Other long term (current) drug therapy: Secondary | ICD-10-CM | POA: Insufficient documentation

## 2018-10-10 DIAGNOSIS — I10 Essential (primary) hypertension: Secondary | ICD-10-CM | POA: Diagnosis not present

## 2018-10-10 DIAGNOSIS — I2510 Atherosclerotic heart disease of native coronary artery without angina pectoris: Secondary | ICD-10-CM | POA: Insufficient documentation

## 2018-10-10 LAB — HEPATIC FUNCTION PANEL
ALT: 72 IU/L — ABNORMAL HIGH (ref 0–44)
AST: 49 IU/L — ABNORMAL HIGH (ref 0–40)
Albumin: 4.3 g/dL (ref 3.8–4.9)
Alkaline Phosphatase: 81 IU/L (ref 39–117)
BILIRUBIN, DIRECT: 0.22 mg/dL (ref 0.00–0.40)
Bilirubin Total: 0.7 mg/dL (ref 0.0–1.2)
Total Protein: 6.5 g/dL (ref 6.0–8.5)

## 2018-10-10 LAB — LIPID PANEL
Chol/HDL Ratio: 5.3 ratio — ABNORMAL HIGH (ref 0.0–5.0)
Cholesterol, Total: 159 mg/dL (ref 100–199)
HDL: 30 mg/dL — ABNORMAL LOW (ref >39)
LDL Calculated: 79 mg/dL (ref 0–99)
Triglycerides: 248 mg/dL — ABNORMAL HIGH (ref 0–149)
VLDL Cholesterol Cal: 50 mg/dL — ABNORMAL HIGH (ref 5–40)

## 2018-10-10 NOTE — Progress Notes (Signed)
Primary Care Physician: Mariane DuvalPetry, Craig J, MD Primary Cardiologist: Dr Georgana CurioJunpaparp Primary Electrophysiologist: Dr Elberta Fortisamnitz Referring Physician: Dr Pennie Banteramnitz   Austin Barker is a 58 y.o. male with a history of paroxysmal atrial fibrillation, OSA, HTN, HLD who [facility]  The patient was initially diagnosed with atrial fibrillation 05/2018 at his PCP after presenting with symptoms of fatigue and exertional dyspnea. S/p DCCV on 06/19/18 and 06/30/18 with early return of afib. Now s/p afib ablation by Dr Elberta Fortisamnitz 09/12/18. He has done very well and denies chest pain, swallowing, or groin issues.  Today, he denies symptoms of palpitations, chest pain, shortness of breath, orthopnea, PND, lower extremity edema, dizziness, presyncope, syncope, snoring, daytime somnolence, bleeding, or neurologic sequela. The patient is tolerating medications without difficulties and is otherwise without complaint today.    Atrial Fibrillation Risk Factors:  he does have symptoms or diagnosis of sleep apnea. he is compliant with CPAP therapy. he does not have a history of rheumatic fever. he does not have a history of alcohol use. he has a BMI of There is no height or weight on file to calculate BMI.. There were no vitals filed for this visit. Family History  Problem Relation Age of Onset  . Diabetes Father   . Heart attack Brother 9540   The patient does not have a history of early familial atrial fibrillation or other arrhythmias.   Atrial Fibrillation Management history:  Previous antiarrhythmic drugs: amiodarone Previous cardioversions: 06/19/18, 06/30/18 Previous ablations: 09/12/18 CHADS2VASC score: 1 Anticoagulation history: Xarelto   Past Medical History:  Diagnosis Date  . A-fib (HCC)   . Coronary artery disease   . Dyslipidemia   . Essential hypertension, benign   . Family history of abdominal aortic aneurysm (AAA)   . FH: coronary  artery disease   . GERD (gastroesophageal reflux disease)   . Gout, unspecified   . Long term (current) use of anticoagulants   . Morbid obesity (HCC)   . OSA on CPAP    Past Surgical History:  Procedure Laterality Date  . ATRIAL FIBRILLATION ABLATION N/A 09/12/2018   Procedure: ATRIAL FIBRILLATION ABLATION;  Surgeon: Regan Lemmingamnitz, Will Martin, MD;  Location: [facility];  Service: Cardiovascular;  Laterality: N/A;  . CARDIOVERSION  06/30/2018    Current Outpatient Medications  Medication Sig Dispense Refill  . amiodarone (PACERONE) 200 MG tablet Take 200 mg by mouth daily.    Marland Kitchen. atenolol (TENORMIN) 50 MG tablet Take 50 mg by mouth daily.    Marland Kitchen. atorvastatin (LIPITOR) 20 MG tablet Take 20 mg by mouth daily.    . benazepril (LOTENSIN) 40 MG tablet Take 40 mg by mouth daily.    . cetirizine (ZYRTEC) 10 MG tablet Take 10 mg by mouth daily.    . Febuxostat (ULORIC) 80 MG TABS Take 80 mg by mouth daily.     . fenofibrate 160 MG tablet Take 160 mg by mouth daily.    . hydrochlorothiazide (HYDRODIURIL) 25 MG tablet Take 25 mg by mouth daily.    . Multiple Vitamins-Minerals (CENTRUM SILVER 50+MEN) TABS Take 1 tablet by mouth daily.     . Omega-3 Fatty Acids (FISH OIL) 1000 MG CAPS Take 1,000 mg by mouth daily.     Marland Kitchen. omeprazole (PRILOSEC) 20 MG capsule Take 20 mg by mouth daily.    . rivaroxaban (XARELTO) 20 MG TABS tablet Take 20 mg by mouth daily with supper.     No current  facility-administered medications for this encounter.     Allergies  Allergen Reactions  . Oxycodone     Social History   Socioeconomic History  . Marital status: Married    Spouse name: Not on file  . Number of children: Not on file  . Years of education: Not on file  . Highest education level: Not on file  Occupational History  . Not on file  Social Needs  . Financial resource strain: Not on file  . Food insecurity:    Worry: Not on file    Inability: Not on file  . Transportation needs:    Medical:  Not on file    Non-medical: Not on file  Tobacco Use  . Smoking status: Never Smoker  . Smokeless tobacco: Never Used  Substance and Sexual Activity  . Alcohol use: Not on file  . Drug use: Not on file  . Sexual activity: Not on file    Comment: married....  Lifestyle  . Physical activity:    Days per week: Not on file    Minutes per session: Not on file  . Stress: Not on file  Relationships  . Social connections:    Talks on phone: Not on file    Gets together: Not on file    Attends religious service: Not on file    Active member of club or organization: Not on file    Attends meetings of clubs or organizations: Not on file    Relationship status: Not on file  . Intimate partner violence:    Fear of current or ex partner: Not on file    Emotionally abused: Not on file    Physically abused: Not on file    Forced sexual activity: Not on file  Other Topics Concern  . Not on file  Social History Narrative  . Not on file     ROS- All systems are reviewed and negative except as per the HPI above.  Physical Exam: There were no vitals filed for this visit.  GEN- The patient is well appearing, alert and oriented x 3 today.   Head- normocephalic, atraumatic Eyes-  Sclera clear, conjunctiva pink Ears- hearing intact Oropharynx- clear Neck- supple  Lungs- Clear to ausculation bilaterally, normal work of breathing Heart- Regular rate and rhythm, no murmurs, rubs or gallops  GI- soft, NT, ND, + BS Extremities- no clubbing, cyanosis, or edema MS- no significant deformity or atrophy Skin- no rash or lesion Psych- euthymic mood, full affect Neuro- strength and sensation are intact  Wt Readings from Last 3 Encounters:  09/29/18 (!) 141.5 kg  09/12/18 (!) 141.5 kg  08/05/18 (!) 142.6 kg    EKG today demonstrates SR HR 67, PR 166, QRS 76, QTc 443, motion artifact  Echo 06/16/18 demonstrated LVEF 55-60%, mild LVH, mild MR, mild aortic root dilation (43mm), LA normal  size.  Epic records are reviewed at length today  Assessment and Plan:  1. Paroxysmal atrial fibrillation S/p afib ablation by Dr Elberta Fortis 09/12/18.  Doing well with no afib noted.  Continue anticoagulation for 3 months post procedure. Continue amiodarone and atenolol for now. Will likely stop amiodarone on follow up.  This patients CHA2DS2-VASc Score and unadjusted Ischemic Stroke Rate (% per year) is equal to 0.6 % stroke rate/year from a score of 1  Above score calculated as 1 point each if present [CHF, HTN, DM, Vascular=MI/PAD/Aortic Plaque, Age if 65-74, or Male] Above score calculated as 2 points each if present [Age > 75,  or Stroke/TIA/TE]   2. HTN Stable, no changes today.  3. Obstructive sleep apnea The importance of adequate treatment of sleep apnea was discussed today in order to improve our ability to maintain sinus rhythm long term. Compliant with CPAP therapy.  4. Obesity Body mass index is 42.4 kg/m. Patient is participating in exercise/nutrition program with good results. Encouraged him to continue.  Follow up with Dr Elberta Fortisamnitz as scheduled.  Elvina Sidleonna C. Matthew Folksarroll, ANP-C [facility] [address] [place], [place] [address] [phone]

## 2018-10-14 ENCOUNTER — Telehealth: Payer: Self-pay | Admitting: Interventional Cardiology

## 2018-10-14 DIAGNOSIS — R945 Abnormal results of liver function studies: Principal | ICD-10-CM

## 2018-10-14 DIAGNOSIS — R7989 Other specified abnormal findings of blood chemistry: Secondary | ICD-10-CM

## 2018-10-14 NOTE — Telephone Encounter (Signed)
Patient notified of result.  Please refer to phone note from today for complete details.   Danielle Rankin, [facility] 10/14/2018 4:06 PM   Pt advised of lab results and recommendations to stop Amiodarone and recheck labs in 1 month. Pt is agreeable to plan of care. I have placed order for LFT 11/17/18 per Dr.Camnitz. pt thanked me for the call.

## 2018-10-14 NOTE — Telephone Encounter (Signed)
New Message  PT is calling for test results  Please call

## 2018-10-14 NOTE — Telephone Encounter (Signed)
-----   Message from Will Jorja Loa, MD sent at 10/13/2018 10:47 AM EST ----- LFTs elevated.  Stop amiodarone.  Will need a recheck within 1 month.

## 2018-10-24 ENCOUNTER — Ambulatory Visit: Payer: BLUE CROSS/BLUE SHIELD | Admitting: Interventional Cardiology

## 2018-11-17 ENCOUNTER — Other Ambulatory Visit: Payer: BLUE CROSS/BLUE SHIELD | Admitting: *Deleted

## 2018-11-17 ENCOUNTER — Other Ambulatory Visit: Payer: Self-pay

## 2018-11-17 DIAGNOSIS — R7989 Other specified abnormal findings of blood chemistry: Secondary | ICD-10-CM

## 2018-11-17 DIAGNOSIS — R945 Abnormal results of liver function studies: Principal | ICD-10-CM

## 2018-11-17 LAB — HEPATIC FUNCTION PANEL
ALBUMIN: 4.2 g/dL (ref 3.8–4.9)
ALT: 70 IU/L — ABNORMAL HIGH (ref 0–44)
AST: 44 IU/L — ABNORMAL HIGH (ref 0–40)
Alkaline Phosphatase: 84 IU/L (ref 39–117)
BILIRUBIN, DIRECT: 0.21 mg/dL (ref 0.00–0.40)
Bilirubin Total: 0.5 mg/dL (ref 0.0–1.2)
Total Protein: 6.5 g/dL (ref 6.0–8.5)

## 2018-11-20 ENCOUNTER — Telehealth: Payer: Self-pay | Admitting: *Deleted

## 2018-11-20 DIAGNOSIS — R945 Abnormal results of liver function studies: Principal | ICD-10-CM

## 2018-11-20 DIAGNOSIS — R7989 Other specified abnormal findings of blood chemistry: Secondary | ICD-10-CM

## 2018-11-20 NOTE — Telephone Encounter (Signed)
-----   Message from Will Jorja Loa, MD sent at 11/18/2018 10:34 AM EDT ----- LFTs remain elevated.  Agree with holding amiodarone.  Will likely need a recheck in 1 month.

## 2018-11-20 NOTE — Telephone Encounter (Signed)
Notes recorded by Tarri Fuller, CMA on 11/20/2018 at 10:40 AM EDT Patient notified of result. Please refer to phone note from today for complete details.  Danielle Rankin, [facility] 11/20/2018 10:39 AM   Pt is agreeable to repeat LFT to be done in 1 month. Lab appt scheduled for 12/18/18, though pt does have appt with Dr. Elberta Fortis 12/11/18. I did tell the pt that if Dr. Elberta Fortis decides to check lab at his appt 12/11/18 we can cancel the 12/18/18 lab appt. Pt thanked me for the call.

## 2018-11-25 ENCOUNTER — Encounter: Payer: Self-pay | Admitting: *Deleted

## 2018-11-25 DIAGNOSIS — I10 Essential (primary) hypertension: Secondary | ICD-10-CM | POA: Insufficient documentation

## 2018-11-25 DIAGNOSIS — M109 Gout, unspecified: Secondary | ICD-10-CM | POA: Insufficient documentation

## 2018-11-25 DIAGNOSIS — G4730 Sleep apnea, unspecified: Secondary | ICD-10-CM | POA: Insufficient documentation

## 2018-11-25 DIAGNOSIS — E785 Hyperlipidemia, unspecified: Secondary | ICD-10-CM | POA: Insufficient documentation

## 2018-12-01 ENCOUNTER — Telehealth: Payer: Self-pay | Admitting: *Deleted

## 2018-12-01 NOTE — Telephone Encounter (Signed)
Called patient to let them know due to recent COVID19 CDC and Health Department Protocols, we are not seeing patients in the office on Thursday April 9th in [place]. We are instead seeing if they would like to schedule this appointment as a Pension scheme manager or Laptop.  LVMTCB pertaining appointment

## 2018-12-04 ENCOUNTER — Telehealth: Payer: Self-pay | Admitting: *Deleted

## 2018-12-04 NOTE — Telephone Encounter (Signed)
Patient returned call, he works night shift.  Best time to reach him is best him is before 9am tomorrow.

## 2018-12-04 NOTE — Telephone Encounter (Signed)
Second attempt calling patient about appt on April 9th. LVMTCB

## 2018-12-09 NOTE — Telephone Encounter (Signed)
LMTCB

## 2018-12-11 ENCOUNTER — Ambulatory Visit: Payer: BLUE CROSS/BLUE SHIELD | Admitting: Cardiology

## 2018-12-12 NOTE — Telephone Encounter (Signed)
                                1. Confirm consent - Calling patient to let them know due to recent COVID19 CDC and Health Department Protocols, we are not seeing patients in the office. We are instead seeing if they would like to schedule this appointment as a  Virtual Appointment VIA Smartphone or Laptop. Patient is aware if they decide to reschedule this appointment, they may not be seen or scheduled for the next 4-6 months. As with many in-office visits we must obtain consent. If you'd like, we can send this to patient's mychart (if signed up).  Otherwise, we can obtain verbal consent now.  All virtual visits are billed to patient's insurance company just like a normal visit.  By patient agreeing to a virtual visit, patient is aware that the technology does not allow provider to perform a hands on physical examination, and thus may limit your provider's ability to fully assess your condition.  Patient is aware we cannot assure that it will always work on either patient or our end, and in the setting of a video visit, we may have to convert it to a phone-only visit.  In either situation, we cannot ensure that we have a secure connection. Is patient willing to proceed?  YES   2. Give patient instructions for WebEx/Doximity download to smartphone as below if video visit   3. Patient advised to be prepared with any vital sign or heart rhythm information, their current medicines, and paper and pen handy for any instructions they may receive before, during, or after their visit.   4. Patient informed they will receive a phone call 10-15 minutes prior to their appointment time (may be from unknown caller ID) so they should be prepared to answer    ACCEPTING  DOXIMITY VIDEO VISITS  - Patient will receive a text message from a Random Number indicating Provider's Name and a Blue Hyperlink.  Text Message will say Provider sent you a secure message with a Blue Hyperlink Attached  -Patient is to click on  Blue Hyperlink Text message will say "Provider will see you now. Click to join Video call." with another Blue Hyperlink attached.  -Patient is to click Blue hyperlink to Join Video call with said Provider.             

## 2018-12-16 ENCOUNTER — Other Ambulatory Visit: Payer: Self-pay

## 2018-12-16 ENCOUNTER — Telehealth (INDEPENDENT_AMBULATORY_CARE_PROVIDER_SITE_OTHER): Payer: BLUE CROSS/BLUE SHIELD | Admitting: Cardiology

## 2018-12-16 ENCOUNTER — Encounter: Payer: Self-pay | Admitting: Cardiology

## 2018-12-16 DIAGNOSIS — I4819 Other persistent atrial fibrillation: Secondary | ICD-10-CM

## 2018-12-16 MED ORDER — ATORVASTATIN CALCIUM 40 MG PO TABS: 40.0000 mg | ORAL_TABLET | Freq: Every day | ORAL | 3 refills | Status: AC

## 2018-12-16 NOTE — Patient Instructions (Addendum)
Medication Instructions:  Your physician has recommended you make the following change in your medication:  1. INCREASE Lipitor to 40 mg daily  * If you need a refill on your cardiac medications before your next appointment, please call your pharmacy.   Labwork: Your physician recommends that you return for lab work in: 3 months for fasting Lipid profile.  You can do this the same day as your follow up appointment with Dr. Elberta Fortis.  You will need to be fasting for this blood work. *We will only notify you of abnormal results, otherwise continue current treatment plan.  Testing/Procedures: None ordered  Follow-Up: You are scheduled for follow up on 03/20/2019 @ 8:30 am with Dr. Elberta Fortis   Thank you for choosing [facility]!! Dory Horn, RN [phone]  Any Other Special Instructions Will Be Listed Below (If Applicable).

## 2018-12-16 NOTE — Telephone Encounter (Signed)
Virtual Visit Pre-Appointment Phone Call  Steps For Call:  1. Confirm consent - "In the setting of the current Covid19 crisis, you are scheduled for a (phone or video) visit with your provider on (date) at (time).  Just as we do with many in-office visits, in order for you to participate in this visit, we must obtain consent.  If you'd like, I can send this to your mychart (if signed up) or email for you to review.  Otherwise, I can obtain your verbal consent now.  All virtual visits are billed to your insurance company just like a normal visit would be.  By agreeing to a virtual visit, we'd like you to understand that the technology does not allow for your provider to perform an examination, and thus may limit your provider's ability to fully assess your condition.  Finally, though the technology is pretty good, we cannot assure that it will always work on either your or our end, and in the setting of a video visit, we may have to convert it to a phone-only visit.  In either situation, we cannot ensure that we have a secure connection.  Are you willing to proceed?"  2. Give patient instructions for WebEx download to smartphone as below if video visit  3. Advise patient to be prepared with any vital sign or heart rhythm information, their current medicines, and a piece of paper and pen handy for any instructions they may receive the day of their visit  4. Inform patient they will receive a phone call 15 minutes prior to their appointment time (may be from unknown caller ID) so they should be prepared to answer  5. Confirm that appointment type is correct in Epic appointment notes (video vs telephone)    TELEPHONE CALL NOTE  Austin Barker has been deemed a candidate for a follow-up tele-health visit to limit community exposure during the Covid-19 pandemic. I spoke with the patient via phone to ensure availability of phone/video source, confirm preferred email & phone number, and discuss  instructions and expectations.  I reminded Austin Barker to be prepared with any vital sign and/or heart rhythm information that could potentially be obtained via home monitoring, at the time of his visit. I reminded Austin Barker to expect a phone call at the time of his visit if his visit.  Did the patient verbally acknowledge consent to treatment? YES  Baird Lyons, RN 12/16/2018 8:35 AM   DOWNLOADING THE WEBEX SOFTWARE TO SMARTPHONE  - If Apple, go to [employer] and type in WebEx in the search bar. Download [employer], the blue/green circle. The app is free but as with any other app downloads, their phone may require them to verify saved payment information or Apple password. The patient does NOT have to create an account.  - If Android, ask patient to go to [employer] and type in WebEx in the search bar. Download [employer], the blue/green circle. The app is free but as with any other app downloads, their phone may require them to verify saved payment information or Android password. The patient does NOT have to create an account.   CONSENT FOR TELE-HEALTH VISIT - PLEASE REVIEW  I hereby voluntarily request, consent and authorize [facility] and its employed or contracted physicians, physician assistants, nurse practitioners or other licensed health care professionals (the Practitioner), to provide me with telemedicine health care services (the "Services") as deemed necessary by the treating Practitioner.  I acknowledge and consent to receive the Services by the Practitioner via telemedicine. I understand that the telemedicine visit will involve communicating with the Practitioner through live audiovisual communication technology and the disclosure of certain medical information by electronic transmission. I acknowledge that I have been given the opportunity to request an in-person assessment or other available alternative prior to the telemedicine visit and  am voluntarily participating in the telemedicine visit.  I understand that I have the right to withhold or withdraw my consent to the use of telemedicine in the course of my care at any time, without affecting my right to future care or treatment, and that the Practitioner or I may terminate the telemedicine visit at any time. I understand that I have the right to inspect all information obtained and/or recorded in the course of the telemedicine visit and may receive copies of available information for a reasonable fee.  I understand that some of the potential risks of receiving the Services via telemedicine include:  Marland Kitchen Delay or interruption in medical evaluation due to technological equipment failure or disruption; . Information transmitted may not be sufficient (e.g. poor resolution of images) to allow for appropriate medical decision making by the Practitioner; and/or  . In rare instances, security protocols could fail, causing a breach of personal health information.  Furthermore, I acknowledge that it is my responsibility to provide information about my medical history, conditions and care that is complete and accurate to the best of my ability. I acknowledge that Practitioner's advice, recommendations, and/or decision may be based on factors not within their control, such as incomplete or inaccurate data provided by me or distortions of diagnostic images or specimens that may result from electronic transmissions. I understand that the practice of medicine is not an exact science and that Practitioner makes no warranties or guarantees regarding treatment outcomes. I acknowledge that I will receive a copy of this consent concurrently upon execution via email to the email address I last provided but may also request a printed copy by calling the office of [place].    I understand that my insurance will be billed for this visit.   I have read or had this consent read to me. . I understand the  contents of this consent, which adequately explains the benefits and risks of the Services being provided via telemedicine.  . I have been provided ample opportunity to ask questions regarding this consent and the Services and have had my questions answered to my satisfaction. . I give my informed consent for the services to be provided through the use of telemedicine in my medical care  By participating in this telemedicine visit I agree to the above.

## 2018-12-16 NOTE — Progress Notes (Signed)
Electrophysiology TeleHealth Note   Due to national recommendations of social distancing due to COVID 19, an audio/video telehealth visit is felt to be most appropriate for this patient at this time.  See MyChart message from today for the patient's consent to telehealth for [facility].   Date:  12/16/2018   ID:  Austin Barker, DOB [birth year], MRN [id number]  Location: patient's home  Provider location: [address] [place]  Evaluation Performed: Follow-up visit  PCP:  Mariane Duval, MD  Cardiologist:  Georgana Curio Electrophysiologist:  Dr Elberta Fortis  Chief Complaint:  AF  History of Present Illness:    Austin Barker is a 58 y.o. male who presents via audio/video conferencing for a telehealth visit today.  Since last being seen in our clinic, the patient reports doing very well.  Today, he denies symptoms of palpitations, chest pain, shortness of breath,  lower extremity edema, dizziness, presyncope, or syncope.  The patient is otherwise without complaint today.  The patient denies symptoms of fevers, chills, cough, or new SOB worrisome for COVID 19.  He has a history of paroxysmal atrial fibrillation, OSA, hypertension, hyperlipidemia.  He had an AF ablation 09/12/2018.  Today, denies symptoms of palpitations, chest pain, shortness of breath, orthopnea, PND, lower extremity edema, claudication, dizziness, presyncope, syncope, bleeding, or neurologic sequela. The patient is tolerating medications without difficulties. Overall he is doing well.  He has no chest pain or shortness of breath.  Is able to do all of his daily activities.  He is noted no further episodes of atrial fibrillation.  He does say that he continues to travel for work.  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   Past Medical History:  Diagnosis Date  . A-fib (HCC)   . Coronary artery  disease   . Dyslipidemia   . Essential hypertension, benign   . Family history of abdominal aortic aneurysm (AAA)   . FH: coronary artery disease   . GERD (gastroesophageal reflux disease)   . Gout, unspecified   . Long term (current) use of anticoagulants   . Morbid obesity (HCC)   . OSA on CPAP     Past Surgical History:  Procedure Laterality Date  . ATRIAL FIBRILLATION ABLATION N/A 09/12/2018   Procedure: ATRIAL FIBRILLATION ABLATION;  Surgeon: Regan Lemming, MD;  [facility];  Service: Cardiovascular;  Laterality: N/A;  . CARDIOVERSION  06/30/2018    Current Outpatient Medications  Medication Sig Dispense Refill  . atenolol (TENORMIN) 50 MG tablet Take 50 mg by mouth daily.    Marland Kitchen atorvastatin (LIPITOR) 20 MG tablet Take 20 mg by mouth daily.    . benazepril (LOTENSIN) 40 MG tablet Take 40 mg by mouth daily.    . cetirizine (ZYRTEC) 10 MG tablet Take 10 mg by mouth daily.    . Febuxostat (ULORIC) 80 MG TABS Take 80 mg by mouth daily.     . fenofibrate 160 MG tablet Take 160 mg by mouth daily.    . hydrochlorothiazide (HYDRODIURIL) 25 MG tablet Take 25 mg by mouth daily.    . Multiple Vitamins-Minerals (CENTRUM SILVER 50+MEN) TABS Take 1 tablet by mouth daily.     . Omega-3 Fatty Acids (FISH OIL) 1000 MG CAPS Take 1,000 mg by mouth daily.     Marland Kitchen omeprazole (PRILOSEC) 20 MG capsule Take 20  mg by mouth daily.    . rivaroxaban (XARELTO) 20 MG TABS tablet Take 20 mg by mouth daily with supper.     No current facility-administered medications for this visit.     Allergies:   Oxycodone   Social History:  The patient  reports that he has never smoked. He has never used smokeless tobacco.   Family History:  The patient's  family history includes Diabetes in his father; Heart attack (age of onset: 58) in his brother.   ROS:  Please see the history of present illness.   All other systems are personally reviewed and negative.    Exam:    Vital Signs:  BP  128/84   Pulse 72   Wt (!) 310 lb 3.2 oz (140.7 kg)   BMI 40.93 kg/m   Well appearing, alert and conversant, regular work of breathing,  good skin color Eyes- anicteric, neuro- grossly intact, skin- no apparent rash or lesions or cyanosis, mouth- oral mucosa is pink   Labs/Other Tests and Data Reviewed:    Recent Labs: 09/12/2018: BUN 19; Creatinine, Ser 1.51; Hemoglobin 14.4; Platelets 184; Potassium 3.9; Sodium 139 11/17/2018: ALT 70   Wt Readings from Last 3 Encounters:  12/16/18 (!) 310 lb 3.2 oz (140.7 kg)  10/10/18 (!) 321 lb 6.4 oz (145.8 kg)  09/29/18 (!) 312 lb (141.5 kg)     Other studies personally reviewed: Additional studies/ records that were reviewed today include: ECG 10/10/18  Review of the above records today demonstrates: Sinus rhythm, rate 67  Myoview 09/30/18  Nuclear stress EF: 61%. The left ventricular ejection fraction is normal (55-65%).  There was no ST segment deviation noted during stress.  This is a low risk study. There is no evidence of ischemia and no evidence of previous infarction  The study is normal.  ASSESSMENT & PLAN:    1.  Persistent atrial fibrillation: Currently on amiodarone and atenolol.  Is anticoagulated with Xarelto.  He was taken off of his amiodarone as his liver enzymes were elevated.  He has had no further episodes of atrial fibrillation.  This patients CHA2DS2-VASc Score and unadjusted Ischemic Stroke Rate (% per year) is equal to 2.2 % stroke rate/year from a score of 2  Above score calculated as 1 point each if present [CHF, HTN, DM, Vascular=MI/PAD/Aortic Plaque, Age if 65-74, or Male] Above score calculated as 2 points each if present [Age > 75, or Stroke/TIA/TE]  2.  OSA: Encouraged CPAP compliance  3.  Hypertension: Currently well controlled.  4.  Morbid obesity: Diet and exercise recommended  5.  Coronary artery disease: Found a high calcium score on CT scan.  His goal LDL is now 70.   increase Lipitor to  40 mg.    COVID 19 screen The patient denies symptoms of COVID 19 at this time.  The importance of social distancing was discussed today.  Follow-up: 3 months  Current medicines are reviewed at length with the patient today.   The patient does not have concerns regarding his medicines.  The following changes were made today: Increase Lipitor to 40 mg  Labs/ tests ordered today include:  No orders of the defined types were placed in this encounter.    Patient Risk:  after full review of this patients clinical status, I feel that they are at moderate risk at this time.  Today, I have spent 12 minutes with the patient with telehealth technology discussing atrial fibrillation, coronary artery disease.    Signed,   Jorja LoaMartin , MD  12/16/2018 8:55 AM     [facility] [address] [place] [address] [phone] (office) [phone] (fax)

## 2018-12-18 ENCOUNTER — Other Ambulatory Visit: Payer: BLUE CROSS/BLUE SHIELD

## 2019-03-18 ENCOUNTER — Telehealth: Payer: Self-pay | Admitting: Cardiology

## 2019-03-18 NOTE — Telephone Encounter (Signed)

## 2019-03-20 ENCOUNTER — Other Ambulatory Visit: Payer: Self-pay

## 2019-03-20 ENCOUNTER — Encounter: Payer: Self-pay | Admitting: Cardiology

## 2019-03-20 ENCOUNTER — Ambulatory Visit: Payer: BLUE CROSS/BLUE SHIELD | Admitting: Cardiology

## 2019-03-20 VITALS — BP 130/82 | HR 73 | Ht 73.0 in | Wt 315.0 lb

## 2019-03-20 DIAGNOSIS — Z79899 Other long term (current) drug therapy: Secondary | ICD-10-CM

## 2019-03-20 DIAGNOSIS — I480 Paroxysmal atrial fibrillation: Secondary | ICD-10-CM | POA: Diagnosis not present

## 2019-03-20 LAB — COMPREHENSIVE METABOLIC PANEL
ALT: 47 IU/L — ABNORMAL HIGH (ref 0–44)
AST: 33 IU/L (ref 0–40)
Albumin/Globulin Ratio: 1.9 (ref 1.2–2.2)
Albumin: 4.2 g/dL (ref 3.8–4.9)
Alkaline Phosphatase: 86 IU/L (ref 39–117)
BUN/Creatinine Ratio: 14 (ref 9–20)
BUN: 19 mg/dL (ref 6–24)
Bilirubin Total: 0.9 mg/dL (ref 0.0–1.2)
CO2: 27 mmol/L (ref 20–29)
Calcium: 9.3 mg/dL (ref 8.7–10.2)
Chloride: 97 mmol/L (ref 96–106)
Creatinine, Ser: 1.38 mg/dL — ABNORMAL HIGH (ref 0.76–1.27)
GFR calc Af Amer: 65 mL/min/{1.73_m2} (ref >59)
GFR calc non Af Amer: 56 mL/min/{1.73_m2} — ABNORMAL LOW (ref >59)
Globulin, Total: 2.2 g/dL (ref 1.5–4.5)
Glucose: 116 mg/dL — ABNORMAL HIGH (ref 65–99)
Potassium: 4 mmol/L (ref 3.5–5.2)
Sodium: 138 mmol/L (ref 134–144)
Total Protein: 6.4 g/dL (ref 6.0–8.5)

## 2019-03-20 NOTE — Patient Instructions (Addendum)
Medication Instructions:  Your physician recommends that you continue on your current medications as directed. Please refer to the Current Medication list given to you today.  * If you need a refill on your cardiac medications before your next appointment, please call your pharmacy.   Labwork: [place]: CMET *We will only notify you of abnormal results, otherwise continue current treatment plan.  Testing/Procedures: None ordered  Follow-Up: Your physician wants you to follow-up in: 6 months with Dr. Curt Bears.  You will receive a reminder letter in the mail two months in advance. If you don't receive a letter, please call our office to schedule the follow-up appointment.   Thank you for choosing [facility]!!   Trinidad Curet, RN [phone]

## 2019-03-20 NOTE — Progress Notes (Signed)
PCP:  Mariane DuvalPetry, Austin Barker, Austin Barker Primary Cardiologist: No primary care provider on file. Electrophysiologist: Austin Jorja LoaMartin Camnitz, Austin Barker   Austin Barker G Austin Barker is a 58 y.o. male with a history of paroxysmal afib s/p AF ablation 09/12/2018, OSA, HTN, and HLD who presents today for routine electrophysiology followup. Since last being seen in our clinic, the patient reports doing very well.  He has not felt any paroxysms of AF. Uses apple watch to track his rates and rhythm. He is having bilateral knee arthritis and looking at possible knee replacement next years after weight loss. Interested in dieting, difficult to exercise with knees. He is compliant with his Xarelto and uses his CPAP every night.   The patient feels that he is tolerating medications without difficulties and is otherwise without complaint today.   Past Medical History:  Diagnosis Date  . A-fib (HCC)   . Coronary artery disease   . Dyslipidemia   . Essential hypertension, benign   . Family history of abdominal aortic aneurysm (AAA)   . FH: coronary artery disease   . GERD (gastroesophageal reflux disease)   . Gout, unspecified   . Long term (current) use of anticoagulants   . Morbid obesity (HCC)   . OSA on CPAP    Past Surgical History:  Procedure Laterality Date  . ATRIAL FIBRILLATION ABLATION N/A 09/12/2018   Procedure: ATRIAL FIBRILLATION ABLATION;  Surgeon: Regan Lemmingamnitz, Austin Martin, Austin Barker;  [facility];  Service: Cardiovascular;  Laterality: N/A;  . CARDIOVERSION  06/30/2018    Current Outpatient Medications  Medication Sig Dispense Refill  . atenolol (TENORMIN) 50 MG tablet Take 50 mg by mouth daily.    Marland Kitchen. atorvastatin (LIPITOR) 40 MG tablet Take 1 tablet (40 mg total) by mouth daily. 90 tablet 3  . benazepril (LOTENSIN) 40 MG tablet Take 40 mg by mouth daily.    . cetirizine (ZYRTEC) 10 MG tablet Take 10 mg by mouth daily.    . Febuxostat (ULORIC) 80 MG TABS Take 80 mg by mouth daily.     . fenofibrate 160 MG  tablet Take 160 mg by mouth daily.    . hydrochlorothiazide (HYDRODIURIL) 25 MG tablet Take 25 mg by mouth daily.    . Multiple Vitamins-Minerals (CENTRUM SILVER 50+MEN) TABS Take 1 tablet by mouth daily.     . Omega-3 Fatty Acids (FISH OIL) 1000 MG CAPS Take 1,000 mg by mouth daily.     Marland Kitchen. omeprazole (PRILOSEC) 20 MG capsule Take 20 mg by mouth daily.    . rivaroxaban (XARELTO) 20 MG TABS tablet Take 20 mg by mouth daily with supper.     No current facility-administered medications for this visit.     Allergies  Allergen Reactions  . Oxycodone     Social History   Socioeconomic History  . Marital status: Married    Spouse name: Not on file  . Number of children: Not on file  . Years of education: Not on file  . Highest education level: Not on file  Occupational History  . Not on file  Social Needs  . Financial resource strain: Not on file  . Food insecurity    Worry: Not on file    Inability: Not on file  . Transportation needs    Medical: Not on file    Non-medical: Not on file  Tobacco Use  . Smoking status: Never Smoker  . Smokeless tobacco: Never Used  Substance and Sexual Activity  . Alcohol use: Not on file  .  Drug use: Not on file  . Sexual activity: Not on file    Comment: married....  Lifestyle  . Physical activity    Days per week: Not on file    Minutes per session: Not on file  . Stress: Not on file  Relationships  . Social Herbalist on phone: Not on file    Gets together: Not on file    Attends religious service: Not on file    Active member of club or organization: Not on file    Attends meetings of clubs or organizations: Not on file    Relationship status: Not on file  . Intimate partner violence    Fear of current or ex partner: Not on file    Emotionally abused: Not on file    Physically abused: Not on file    Forced sexual activity: Not on file  Other Topics Concern  . Not on file  Social History Narrative  . Not on file      Review of Systems: General: No chills, fever, night sweats or weight changes  Cardiovascular:  No chest pain, dyspnea on exertion, edema, orthopnea, palpitations, paroxysmal nocturnal dyspnea Dermatological: No rash, lesions or masses Respiratory: No cough, dyspnea Urologic: No hematuria, dysuria Abdominal: No nausea, vomiting, diarrhea, bright red blood per rectum, melena, or hematemesis Neurologic: No visual changes, weakness, changes in mental status All other systems reviewed and are otherwise negative except as noted above.  Physical Exam: Vitals:   03/20/19 0835  BP: 130/82  Pulse: 73  Weight: (!) 315 lb (142.9 kg)  Height: 6\' 1"  (1.854 m)   Wt Readings from Last 3 Encounters:  03/20/19 (!) 315 lb (142.9 kg)  12/16/18 (!) 310 lb 3.2 oz (140.7 kg)  10/10/18 (!) 321 lb 6.4 oz (145.8 kg)   GEN- The patient is well appearing, alert and oriented x 3 today.   HEENT: normocephalic, atraumatic; sclera clear, conjunctiva pink; hearing intact; oropharynx clear; neck supple, no JVP Lymph- no cervical lymphadenopathy Lungs- Clear to ausculation bilaterally, normal work of breathing.  No wheezes, rales, rhonchi Heart- Regular rate and rhythm, no murmurs, rubs or gallops, PMI not laterally displaced GI- soft, non-tender, non-distended, bowel sounds present, no hepatosplenomegaly Extremities- no clubbing, cyanosis, or edema; DP/PT/radial pulses 2+ bilaterally MS- no significant deformity or atrophy Skin- warm and dry, no rash or lesion Psych- euthymic mood, full affect Neuro- strength and sensation are intact  Assessment and Plan:  1.  Persistent atrial fibrillation s/p ablation 09/2018: Continue atenolol and Xarelto. Taken off amiodarone with elevated LFTs. Austin recheck CMET today.  This patients CHA2DS2-VASc is at least 2.   2.  OSA: Encouraged nightly CPAP use.   3.  Hypertension: Controlled on current regimen.   4.  Morbid obesity: Encouraged weight loss with diet and  exercise. Talked about the [place] and provided with web address/resources.  5.  Coronary artery disease: Previously found a high calcium score on CT scan.  His goal LDL is now 70 (79 10/2018). Continue Lipitor 40 mg daily. Per PCP.  Austin Meredith Leeds, Austin Barker  03/20/19 8:57 AM  I have seen and examined this patient with Oda Kilts.  Agree with above, note added to reflect my findings.  On exam, RRR, no murmurs, lungs clear.  Patient status post atrial fibrillation ablation.  He has had no further episodes.  He is compliant with his CPAP.  He is currently working to lose weight.  He also has potentially a  knee surgery in the future but Austin need to lose weight prior to that.  Austin M. Camnitz Austin Barker 03/20/2019 8:57 AM

## 2019-04-21 ENCOUNTER — Telehealth: Payer: Self-pay | Admitting: Cardiology

## 2019-04-21 NOTE — Telephone Encounter (Signed)
   Left voicemail for patient to call back to discuss preoperative assessment.   Abigail Butts, PA-C 04/21/19; 2:11 PM

## 2019-04-21 NOTE — Telephone Encounter (Signed)
   Primary Cardiologist: Will Meredith Leeds, MD  Chart reviewed as part of pre-operative protocol coverage. Patient was contacted 04/21/2019 in reference to pre-operative risk assessment for pending surgery as outlined below.  Austin Barker was last seen on 03/20/2019 by Dr. Curt Bears.  Since that day, Austin Barker has done well from a cardiac standpoint. He reports walking regularly and lifting heavy equipment for his job without anginal complaints. No recent palpitations. He can easily complete 4 METs without chest pain or SOB.  Therefore, based on ACC/AHA guidelines, the patient would be at acceptable risk for the planned procedure without further cardiovascular testing.   Per pharmacy recommendations, patient can hold xarelto 3 days prior to his upcoming knee replacement. Xarelto should be restarted as soon as he is cleared to do so by his orthopedic surgeon.   I will route this recommendation to the requesting party via Epic fax function and remove from pre-op pool.  Please call with questions.  Abigail Butts, PA-C 04/21/2019, 2:05 PM

## 2019-04-21 NOTE — Telephone Encounter (Signed)
Patient with diagnosis of afib on Xarelto for anticoagulation.    Procedure: Left Total knee arthroplasty  Date of procedure: 07/20/2019  CHADS2-VASc score of  2 (HTN, CAD)  CrCl 71 ml/min  Per office protocol, patient can hold Xarelto for 3 days prior to procedure.

## 2019-04-21 NOTE — Telephone Encounter (Signed)
   [facility] Pre-operative Risk Assessment    Request for surgical clearance:  1. What type of surgery is being performed?  Left Total knee arthroplasty   2. When is this surgery scheduled?  07/20/2019   3. What type of clearance is required (medical clearance vs. Pharmacy clearance to hold med vs. Both)?  Both  4. Are there any medications that need to be held prior to surgery and how long? Xarelto    5. Practice name and name of physician performing surgery? [facility]- Dr. Maureen Ralphs   6. What is your office phone number? [phone]    7.   What is your office fax number? [place]  8.   Anesthesia type (None, local, MAC, general) ?  Choice  _________________________________________________________________   (provider comments below)

## 2019-06-01 ENCOUNTER — Telehealth: Payer: Self-pay | Admitting: Cardiology

## 2019-06-01 NOTE — Telephone Encounter (Signed)
°*  STAT* If patient is at the pharmacy, call can be transferred to refill team.   1. Which medications need to be refilled? (please list name of each medication and dose if known) atenolol (TENORMIN) 50 MG tablet  2. Which pharmacy/location (including street and city if local pharmacy) is medication to be sent to? [id number] - [place], [place] - [address] AT [address]  3. Do they need a 30 day or 90 day supply? 90 day  Patient has about 3 days worth of meds.

## 2019-06-02 MED ORDER — ATENOLOL 50 MG PO TABS: 50.0000 mg | ORAL_TABLET | Freq: Every day | ORAL | 2 refills | Status: DC

## 2019-06-02 NOTE — Telephone Encounter (Signed)
Pt's medication was sent to pt's pharmacy as requested. Confirmation received.  °

## 2019-07-14 NOTE — Patient Instructions (Addendum)
DUE TO COVID-19 ONLY ONE VISITOR IS ALLOWED TO COME WITH YOU AND STAY IN THE WAITING ROOM ONLY DURING PRE OP AND PROCEDURE DAY OF SURGERY. THE 1 VISITOR MAY VISIT WITH YOU AFTER SURGERY IN YOUR PRIVATE ROOM DURING VISITING HOURS ONLY!  YOU NEED TO HAVE A COVID 19 TEST ON Thursday 07/16/2019 @ 3:00 PM, THIS TEST MUST BE DONE BEFORE SURGERY, COME  [address], [place] , [id number].  [facility]) ONCE YOUR COVID TEST IS COMPLETED, PLEASE BEGIN THE QUARANTINE INSTRUCTIONS AS OUTLINED IN YOUR HANDOUT.                Austin Barker    Your procedure is scheduled on: Monday 07/20/2019   Report to [facility] Main  Entrance    Report to admitting at 5:50 am     Call this number if you have problems the morning of surgery [phone]     Remember: NO SOLID FOOD AFTER MIDNIGHT THE NIGHT PRIOR TO SURGERY. NOTHING BY MOUTH EXCEPT CLEAR LIQUIDS UNTIL 5:20 AM. PLEASE FINISH ENSURE DRINK PER SURGEON ORDER  WHICH NEEDS TO BE COMPLETED AT 5:20 AM .   CLEAR LIQUID DIET   Foods Allowed                                                                     Foods Excluded  Coffee and tea, regular and decaf                             liquids that you cannot  Plain Jell-O any favor except red or purple             see through such as: Fruit ices (not with fruit pulp)                                     milk, soups, orange juice  Iced Popsicles                                    All solid food Carbonated beverages, regular and diet                                    Cranberry, grape and apple juices Sports drinks like Gatorade Lightly seasoned clear broth or consume(fat free) Sugar, honey syrup  Sample Menu Breakfast                                Lunch                                     Supper Cranberry juice                    Beef broth  Chicken broth Jell-O                                     Grape juice                           Apple  juice Coffee or tea                        Jell-O                                      Popsicle                                                Coffee or tea                        Coffee or tea  _____________________________________________________________________       Take these medicines the morning of surgery with A SIP OF WATER: Cetrizine (Zyrtec), Atorvastatin (Lipitor), Fenofibrate, Atenolol (Tenormin), Febuxostat (Uloric), Omeprazole (Prilosec), Visine A If needed    BRUSH YOUR TEETH MORNING OF SURGERY AND RINSE YOUR MOUTH OUT, NO CHEWING GUM CANDY OR MINTS.                               You may not have any metal on your body including hair pins and              piercings     Do not wear jewelry, lotions, powders or cologne, deodorant                         Men may shave face and neck.   PLEASE BRING YOUR MASK AND TUBING FOR YOUR CPAP MACHINE     Do not bring valuables to the hospital. [facility] IS NOT             RESPONSIBLE   FOR VALUABLES.  Contacts, dentures or bridgework may not be worn into surgery.  Leave suitcase in the car. After surgery it may be brought to your room.                  Please read over the following fact sheets you were given: _____________________________________________________________________             [facility] Before surgery, you can play an important role.  Because skin is not sterile, your skin needs to be as free of germs as possible.  You can reduce the number of germs on your skin by washing with CHG (chlorahexidine gluconate) soap before surgery.  CHG is an antiseptic cleaner which kills germs and bonds with the skin to continue killing germs even after washing. Please DO NOT use if you have an allergy to CHG or antibacterial soaps.  If your skin becomes reddened/irritated stop using the CHG and inform your nurse when you arrive at Short Stay. Do not shave (including legs and underarms) for at least 48  hours prior to the first CHG  shower.  You may shave your face/neck. Please follow these instructions carefully:  1.  Shower with CHG Soap the night before surgery and the  morning of Surgery.  2.  If you choose to wash your hair, wash your hair first as usual with your  normal  shampoo.  3.  After you shampoo, rinse your hair and body thoroughly to remove the  shampoo.                             4.  Use CHG as you would any other liquid soap.  You can apply chg directly  to the skin and wash                       Gently with a scrungie or clean washcloth.  5.  Apply the CHG Soap to your body ONLY FROM THE NECK DOWN.   Do not use on face/ open                           Wound or open sores. Avoid contact with eyes, ears mouth and genitals (private parts).                       Wash face,  Genitals (private parts) with your normal soap.             6.  Wash thoroughly, paying special attention to the area where your surgery  will be performed.  7.  Thoroughly rinse your body with warm water from the neck down.  8.  DO NOT shower/wash with your normal soap after using and rinsing off  the CHG Soap.                9.  Pat yourself dry with a clean towel.            10.  Wear clean pajamas.            11.  Place clean sheets on your bed the night of your first shower and do not  sleep with pets. Day of Surgery : Do not apply any lotions/deodorants the morning of surgery.  Please wear clean clothes to the hospital/surgery center.  FAILURE TO FOLLOW THESE INSTRUCTIONS MAY RESULT IN THE CANCELLATION OF YOUR SURGERY PATIENT SIGNATURE_________________________________  NURSE SIGNATURE__________________________________  ________________________________________________________________________   Austin Barker  An incentive spirometer is a tool that can help keep your lungs clear and active. This tool measures how well you are filling your lungs with each breath. Taking long deep breaths may  help reverse or decrease the chance of developing breathing (pulmonary) problems (especially infection) following:  A long period of time when you are unable to move or be active. BEFORE THE PROCEDURE   If the spirometer includes an indicator to show your best effort, your nurse or respiratory therapist will set it to a desired goal.  If possible, sit up straight or lean slightly forward. Try not to slouch.  Hold the incentive spirometer in an upright position. INSTRUCTIONS FOR USE  1. Sit on the edge of your bed if possible, or sit up as far as you can in bed or on a chair. 2. Hold the incentive spirometer in an upright position. 3. Breathe out normally. 4. Place the mouthpiece in your mouth and seal your lips tightly around it. 5. Breathe in slowly and as  deeply as possible, raising the piston or the ball toward the top of the column. 6. Hold your breath for 3-5 seconds or for as long as possible. Allow the piston or ball to fall to the bottom of the column. 7. Remove the mouthpiece from your mouth and breathe out normally. 8. Rest for a few seconds and repeat Steps 1 through 7 at least 10 times every 1-2 hours when you are awake. Take your time and take a few normal breaths between deep breaths. 9. The spirometer may include an indicator to show your best effort. Use the indicator as a goal to work toward during each repetition. 10. After each set of 10 deep breaths, practice coughing to be sure your lungs are clear. If you have an incision (the cut made at the time of surgery), support your incision when coughing by placing a pillow or rolled up towels firmly against it. Once you are able to get out of bed, walk around indoors and cough well. You may stop using the incentive spirometer when instructed by your caregiver.  RISKS AND COMPLICATIONS  Take your time so you do not get dizzy or light-headed.  If you are in pain, you may need to take or ask for pain medication before doing  incentive spirometry. It is harder to take a deep breath if you are having pain. AFTER USE  Rest and breathe slowly and easily.  It can be helpful to keep track of a log of your progress. Your caregiver can provide you with a simple table to help with this. If you are using the spirometer at home, follow these instructions: SEEK MEDICAL CARE IF:   You are having difficultly using the spirometer.  You have trouble using the spirometer as often as instructed.  Your pain medication is not giving enough relief while using the spirometer.  You develop fever of 100.5 F (38.1 C) or higher. SEEK IMMEDIATE MEDICAL CARE IF:   You cough up bloody sputum that had not been present before.  You develop fever of 102 F (38.9 C) or greater.  You develop worsening pain at or near the incision site. MAKE SURE YOU:   Understand these instructions.  Will watch your condition.  Will get help right away if you are not doing well or get worse. Document Released: 12/31/2006 Document Revised: 11/12/2011 Document Reviewed: 03/03/2007 ExitCare Patient Information 2014 ExitCare, [place].   ________________________________________________________________________  WHAT IS A BLOOD TRANSFUSION? Blood Transfusion Information  A transfusion is the replacement of blood or some of its parts. Blood is made up of multiple cells which provide different functions.  Red blood cells carry oxygen and are used for blood loss replacement.  White blood cells fight against infection.  Platelets control bleeding.  Plasma helps clot blood.  Other blood products are available for specialized needs, such as hemophilia or other clotting disorders. BEFORE THE TRANSFUSION  Who gives blood for transfusions?   Healthy volunteers who are fully evaluated to make sure their blood is safe. This is blood bank blood. Transfusion therapy is the safest it has ever been in the practice of medicine. Before blood is taken from a  donor, a complete history is taken to make sure that person has no history of diseases nor engages in risky social behavior (examples are intravenous drug use or sexual activity with multiple partners). The donor's travel history is screened to minimize risk of transmitting infections, such as malaria. The donated blood is tested for signs of infectious  diseases, such as HIV and hepatitis. The blood is then tested to be sure it is compatible with you in order to minimize the chance of a transfusion reaction. If you or a relative donates blood, this is often done in anticipation of surgery and is not appropriate for emergency situations. It takes many days to process the donated blood. RISKS AND COMPLICATIONS Although transfusion therapy is very safe and saves many lives, the main dangers of transfusion include:   Getting an infectious disease.  Developing a transfusion reaction. This is an allergic reaction to something in the blood you were given. Every precaution is taken to prevent this. The decision to have a blood transfusion has been considered carefully by your caregiver before blood is given. Blood is not given unless the benefits outweigh the risks. AFTER THE TRANSFUSION  Right after receiving a blood transfusion, you will usually feel much better and more energetic. This is especially true if your red blood cells have gotten low (anemic). The transfusion raises the level of the red blood cells which carry oxygen, and this usually causes an energy increase.  The nurse administering the transfusion will monitor you carefully for complications. HOME CARE INSTRUCTIONS  No special instructions are needed after a transfusion. You may find your energy is better. Speak with your caregiver about any limitations on activity for underlying diseases you may have. SEEK MEDICAL CARE IF:   Your condition is not improving after your transfusion.  You develop redness or irritation at the intravenous (IV)  site. SEEK IMMEDIATE MEDICAL CARE IF:  Any of the following symptoms occur over the next 12 hours:  Shaking chills.  You have a temperature by mouth above 102 F (38.9 C), not controlled by medicine.  Chest, back, or muscle pain.  People around you feel you are not acting correctly or are confused.  Shortness of breath or difficulty breathing.  Dizziness and fainting.  You get a rash or develop hives.  You have a decrease in urine output.  Your urine turns a dark color or changes to pink, red, or brown. Any of the following symptoms occur over the next 10 days:  You have a temperature by mouth above 102 F (38.9 C), not controlled by medicine.  Shortness of breath.  Weakness after normal activity.  The white part of the eye turns yellow (jaundice).  You have a decrease in the amount of urine or are urinating less often.  Your urine turns a dark color or changes to pink, red, or brown. Document Released: 08/17/2000 Document Revised: 11/12/2011 Document Reviewed: 04/05/2008 [facility] Patient Information 2014 [place], [place].  _______________________________________________________________________

## 2019-07-15 NOTE — H&P (Signed)
TOTAL KNEE ADMISSION H&P  Patient is being admitted for left total knee arthroplasty.  Subjective:  Chief Complaint:left knee pain.  HPI: Austin Barker, 58 y.o. male, has a history of pain and functional disability in the left knee due to arthritis and has failed non-surgical conservative treatments for greater than 12 weeks to includecorticosteriod injections and activity modification.  Onset of symptoms was gradual, starting 3 years ago with gradually worsening course since that time. The patient noted prior procedures on the knee to include  arthroscopy and menisectomy on the left knee(s).  Patient currently rates pain in the left knee(s) at 7 out of 10 with activity. Patient has worsening of pain with activity and weight bearing and pain that interferes with activities of daily living.  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 by imaging studies.  There is no active infection.  Patient Active Problem List   Diagnosis Date Noted   Gout 11/25/2018   Hyperlipidemia 11/25/2018   Hypertension 11/25/2018   Morbid obesity ([place]) 11/25/2018   Sleep apnea 11/25/2018   Past Medical History:  Diagnosis Date   A-fib [facility])    Coronary artery disease    Dyslipidemia    Essential hypertension, benign    Family history of abdominal aortic aneurysm (AAA)    FH: coronary artery disease    GERD (gastroesophageal reflux disease)    Gout, unspecified    Long term (current) use of anticoagulants    Morbid obesity ([facility])    OSA on CPAP     Past Surgical History:  Procedure Laterality Date   ATRIAL FIBRILLATION ABLATION N/A 09/12/2018   Procedure: ATRIAL FIBRILLATION ABLATION;  Surgeon: Constance [facility], MD;  Location: [facility];  Service: Cardiovascular;  Laterality: N/A;   CARDIOVERSION  06/30/2018    No current facility-administered medications for this encounter.    Current Outpatient Medications    Medication Sig Dispense Refill Last Dose   acetaminophen (TYLENOL) 500 MG tablet Take 1,000 mg by mouth every 6 (six) hours as needed for mild pain or headache.      atenolol (TENORMIN) 50 MG tablet Take 1 tablet (50 mg total) by mouth daily. 90 tablet 2    atorvastatin (LIPITOR) 40 MG tablet Take 1 tablet (40 mg total) by mouth daily. (Patient taking differently: Take 20 mg by mouth daily. ) 90 tablet 3    benazepril (LOTENSIN) 40 MG tablet Take 40 mg by mouth daily.      cetirizine (ZYRTEC) 10 MG tablet Take 10 mg by mouth daily.      Febuxostat (ULORIC) 80 MG TABS Take 80 mg by mouth daily.       fenofibrate 160 MG tablet Take 160 mg by mouth daily.      hydrochlorothiazide (HYDRODIURIL) 25 MG tablet Take 25 mg by mouth daily.      Multiple Vitamins-Minerals (CENTRUM SILVER 50+MEN) TABS Take 1 tablet by mouth daily.       naphazoline-pheniramine (VISINE-A) 0.025-0.3 % ophthalmic solution Place 1 drop into both eyes daily as needed for eye irritation.      Omega-3 Fatty Acids (FISH OIL) 1000 MG CAPS Take 1,000 mg by mouth daily.       omeprazole (PRILOSEC) 20 MG capsule Take 20 mg by mouth daily.      rivaroxaban (XARELTO) 20 MG TABS tablet Take 20 mg by mouth daily.       No Known Allergies  Social History   Tobacco Use  Smoking status: Never Smoker   Smokeless tobacco: Never Used  Substance Use Topics   Alcohol use: Not on file    Family History  Problem Relation Age of Onset   Diabetes Father    Heart attack Brother 29     Review of Systems  Constitutional: Negative for chills and fever.  Respiratory: Negative for cough and shortness of breath.   Cardiovascular: Negative for chest pain and palpitations.  Gastrointestinal: Negative for nausea and vomiting.  Musculoskeletal: Positive for joint pain.    Objective:  Physical Exam Patient is a 58 year old male.  Well nourished and well developed. General: Alert and oriented x3, cooperative and  pleasant, no acute distress. Head: normocephalic, atraumatic, neck supple. Eyes: EOMI. Respiratory: breath sounds clear in all fields, no wheezing, rales, or rhonchi. Cardiovascular: Regular rate and rhythm, no murmurs, gallops or rubs. Abdomen: non-tender to palpation and soft, normoactive bowel sounds.  Musculoskeletal: Left Knee Exam: Minimal varus deformity. No effusion present. No swelling present. The range of motion is: 5 to 125 degrees. Moderate crepitus on range of motion of the knee. Positive medial greater than lateral joint line tenderness. The knee is stable.  Calves soft and nontender. Motor function intact in LE. Strength 5/5 LE bilaterally. Neuro: Distal pulses 2+. Sensation to light touch intact in LE. Vital signs in last 24 hours:  Labs:   Estimated body mass index is 41.56 kg/m as calculated from the following:   Height as of 03/20/19: 6\' 1"  (1.854 m).   Weight as of 03/20/19: 142.9 kg.   Imaging Review Plain radiographs demonstrate severe degenerative joint disease of the left knee(s). The overall alignment isneutral. The bone quality appears to be adequate for age and reported activity level.  Assessment/Plan:  End stage arthritis, left knee   The patient history, physical examination, clinical judgment of the provider and imaging studies are consistent with end stage degenerative joint disease of the left knee(s) and total knee arthroplasty is deemed medically necessary. The treatment options including medical management, injection therapy arthroscopy and arthroplasty were discussed at length. The risks and benefits of total knee arthroplasty were presented and reviewed. The risks due to aseptic loosening, infection, stiffness, patella tracking problems, thromboembolic complications and other imponderables were discussed. The patient acknowledged the explanation, agreed to proceed with the plan and consent was signed. Patient is being admitted for inpatient  treatment for surgery, pain control, PT, OT, prophylactic antibiotics, VTE prophylaxis, progressive ambulation and ADL's and discharge planning. The patient is planning to be discharged home.  Therapy Plans: outpatient therapy at [facility], then home with wife Planned DVT Prophylaxis: Xarelto 20mg  daily (hx of a fib) DME needed: none PCP: Dr. 03/22/19 Cardiologist: Dr. , clearance received TXA: IV Allergies: NKDA Anesthesia Concerns: none BMI: 39.4  Other: Hx of a fib, s/p ablation in January 2020.  Patient's anticipated LOS is less than 2 midnights, meeting these requirements: - Younger than 90 - Lives within 1 hour of care - Has a competent adult at home to recover with post-op recover - NO history of  - Chronic pain requiring opiods  - Diabetes  - Coronary Artery Disease  - Heart failure  - Heart attack  - Stroke  - DVT/VTE  - Cardiac arrhythmia  - Respiratory Failure/COPD  - Renal failure  - Anemia  - Advanced Liver disease  - Patient was instructed on what medications to stop prior to surgery. - Follow-up visit in 2 weeks  with Dr. Lequita HaltAluisio - Begin physical therapy following surgery - Pre-operative lab work as pre-surgical testing - Prescriptions will be provided in hospital at time of discharge  Dennie BibleAshley Cheo Selvey, PA-C Orthopedic Surgery [facility] [place] [phone]

## 2019-07-16 ENCOUNTER — Other Ambulatory Visit (HOSPITAL_COMMUNITY)
Admission: RE | Admit: 2019-07-16 | Discharge: 2019-07-16 | Disposition: A | Discharge status: Home / Self Care | Payer: BC Managed Care – PPO | Source: Ambulatory Visit | Attending: Orthopedic Surgery | Admitting: Orthopedic Surgery

## 2019-07-16 ENCOUNTER — Other Ambulatory Visit: Payer: Self-pay

## 2019-07-16 ENCOUNTER — Encounter (HOSPITAL_COMMUNITY)
Admission: RE | Admit: 2019-07-16 | Discharge: 2019-07-16 | Disposition: A | Discharge status: Home / Self Care | Payer: BC Managed Care – PPO | Source: Ambulatory Visit | Attending: Orthopedic Surgery | Admitting: Orthopedic Surgery

## 2019-07-16 ENCOUNTER — Encounter (HOSPITAL_COMMUNITY): Payer: Self-pay

## 2019-07-16 DIAGNOSIS — Z01812 Encounter for preprocedural laboratory examination: Secondary | ICD-10-CM | POA: Insufficient documentation

## 2019-07-16 DIAGNOSIS — G4733 Obstructive sleep apnea (adult) (pediatric): Secondary | ICD-10-CM | POA: Insufficient documentation

## 2019-07-16 DIAGNOSIS — Z20828 Contact with and (suspected) exposure to other viral communicable diseases: Secondary | ICD-10-CM | POA: Diagnosis not present

## 2019-07-16 DIAGNOSIS — I2510 Atherosclerotic heart disease of native coronary artery without angina pectoris: Secondary | ICD-10-CM | POA: Insufficient documentation

## 2019-07-16 DIAGNOSIS — E785 Hyperlipidemia, unspecified: Secondary | ICD-10-CM | POA: Diagnosis not present

## 2019-07-16 DIAGNOSIS — M1712 Unilateral primary osteoarthritis, left knee: Secondary | ICD-10-CM | POA: Insufficient documentation

## 2019-07-16 DIAGNOSIS — Z6839 Body mass index (BMI) 39.0-39.9, adult: Secondary | ICD-10-CM | POA: Diagnosis not present

## 2019-07-16 DIAGNOSIS — M109 Gout, unspecified: Secondary | ICD-10-CM | POA: Insufficient documentation

## 2019-07-16 DIAGNOSIS — Z79899 Other long term (current) drug therapy: Secondary | ICD-10-CM | POA: Diagnosis not present

## 2019-07-16 DIAGNOSIS — I4891 Unspecified atrial fibrillation: Secondary | ICD-10-CM | POA: Insufficient documentation

## 2019-07-16 DIAGNOSIS — K219 Gastro-esophageal reflux disease without esophagitis: Secondary | ICD-10-CM | POA: Insufficient documentation

## 2019-07-16 DIAGNOSIS — Z7901 Long term (current) use of anticoagulants: Secondary | ICD-10-CM | POA: Insufficient documentation

## 2019-07-16 DIAGNOSIS — I10 Essential (primary) hypertension: Secondary | ICD-10-CM | POA: Diagnosis not present

## 2019-07-16 LAB — COMPREHENSIVE METABOLIC PANEL
ALT: 53 U/L — ABNORMAL HIGH (ref 0–44)
AST: 37 U/L (ref 15–41)
Albumin: 4 g/dL (ref 3.5–5.0)
Alkaline Phosphatase: 61 U/L (ref 38–126)
Anion gap: 9 (ref 5–15)
BUN: 25 mg/dL — ABNORMAL HIGH (ref 6–20)
CO2: 26 mmol/L (ref 22–32)
Calcium: 9.4 mg/dL (ref 8.9–10.3)
Chloride: 104 mmol/L (ref 98–111)
Creatinine, Ser: 1.31 mg/dL — ABNORMAL HIGH (ref 0.61–1.24)
GFR calc Af Amer: >60 mL/min (ref >60)
GFR calc non Af Amer: 59 mL/min — ABNORMAL LOW (ref >60)
Glucose, Bld: 110 mg/dL — ABNORMAL HIGH (ref 70–99)
Potassium: 3.6 mmol/L (ref 3.5–5.1)
Sodium: 139 mmol/L (ref 135–145)
Total Bilirubin: 1 mg/dL (ref 0.3–1.2)
Total Protein: 6.9 g/dL (ref 6.5–8.1)

## 2019-07-16 LAB — PROTIME-INR
INR: 1.4 — ABNORMAL HIGH (ref 0.8–1.2)
Prothrombin Time: 17.3 seconds — ABNORMAL HIGH (ref 11.4–15.2)

## 2019-07-16 LAB — CBC
HCT: 43.3 % (ref 39.0–52.0)
Hemoglobin: 14.7 g/dL (ref 13.0–17.0)
MCH: 31.3 pg (ref 26.0–34.0)
MCHC: 33.9 g/dL (ref 30.0–36.0)
MCV: 92.1 fL (ref 80.0–100.0)
Platelets: 205 10*3/uL (ref 150–400)
RBC: 4.7 MIL/uL (ref 4.22–5.81)
RDW: 13 % (ref 11.5–15.5)
WBC: 7.1 10*3/uL (ref 4.0–10.5)
nRBC: 0 % (ref 0.0–0.2)

## 2019-07-16 LAB — APTT: aPTT: 35 seconds (ref 24–36)

## 2019-07-16 LAB — SURGICAL PCR SCREEN
MRSA, PCR: NEGATIVE
Staphylococcus aureus: POSITIVE — AB

## 2019-07-16 LAB — ABO/RH: ABO/RH(D): A POS

## 2019-07-16 NOTE — Anesthesia Preprocedure Evaluation (Addendum)
Anesthesia Evaluation  Patient identified by MRN, date of birth, ID band Patient awake    Reviewed: Allergy & Precautions, NPO status , Patient's Chart, lab work & pertinent test results  Airway Mallampati: II  TM Distance: >3 FB Neck ROM: Full    Dental no notable dental hx.    Pulmonary sleep apnea and Continuous Positive Airway Pressure Ventilation ,    Pulmonary exam normal breath sounds clear to auscultation       Cardiovascular hypertension, Pt. on medications Normal cardiovascular exam Rhythm:Regular Rate:Normal     Neuro/Psych negative neurological ROS  negative psych ROS   GI/Hepatic negative GI ROS, Neg liver ROS,   Endo/Other  Morbid obesity  Renal/GU negative Renal ROS  negative genitourinary   Musculoskeletal negative musculoskeletal ROS (+)   Abdominal   Peds negative pediatric ROS (+)  Hematology negative hematology ROS (+)   Anesthesia Other Findings   Reproductive/Obstetrics negative OB ROS                           Anesthesia Physical Anesthesia Plan  ASA: III  Anesthesia Plan: Spinal   Post-op Pain Management:  Regional for Post-op pain   Induction: Intravenous  PONV Risk Score and Plan: 2 and Ondansetron, Dexamethasone and Treatment may vary due to age or medical condition  Airway Management Planned: Simple Face Mask  Additional Equipment:   Intra-op Plan:   Post-operative Plan:   Informed Consent: I have reviewed the patients History and Physical, chart, labs and discussed the procedure including the risks, benefits and alternatives for the proposed anesthesia with the patient or authorized representative who has indicated his/her understanding and acceptance.     Dental advisory given  Plan Discussed with: CRNA and Surgeon  Anesthesia Plan Comments: (See PAT note 07/16/2019, Konrad Felix, PA-C)       Anesthesia Quick Evaluation

## 2019-07-16 NOTE — Progress Notes (Signed)
Anesthesia Chart Review   Case: [id number] Date/Time: 07/20/19 0805   Procedure: TOTAL KNEE ARTHROPLASTY (Left ) -   Anesthesia type: Choice   Pre-op diagnosis: Left knee osteoarthritis   Location: [facility] ROOM 10 / [facility]   Surgeon: Ollen Gross, MD      DISCUSSION:58 y.o. never smoker with h/o A-fib (on Xarelto), GERD, HTN, CAD, left knee OA scheduled for above procedure 07/20/2019 with Dr. Ollen Gross.   Pt cleared by cardiology 04/21/2019.  Per Judy Pimple, PA-C, "Austin Barker was last seen on 03/20/2019 by Dr. Elberta Fortis.  Since that day, Austin Barker has done well from a cardiac standpoint. He reports walking regularly and lifting heavy equipment for his job without anginal complaints. No recent palpitations. He can easily complete 4 METs without chest pain or SOB.  Therefore, based on ACC/AHA guidelines, the patient would be at acceptable risk for the planned procedure without further cardiovascular testing.  Per pharmacy recommendations, patient can hold xarelto 3 days prior to his upcoming knee replacement. Xarelto should be restarted as soon as he is cleared to do so by his orthopedic surgeon."  Anticipate pt can proceed with planned procedure barring acute status change.   VS: BP (!) 147/84 (BP Location: Right Arm)   Pulse 66   Temp 36.8 C (Oral)   Resp 18   Ht 6\' 1"  (1.854 m)   Wt 135.4 kg   SpO2 98%   BMI 39.38 kg/m   PROVIDERS: , MD is PCP   Mariane Duval, MD is Cardiologist  LABS: Labs reviewed: Acceptable for surgery. (all labs ordered are listed, but only abnormal results are displayed)  Labs Reviewed  COMPREHENSIVE METABOLIC PANEL - Abnormal; Notable for the following components:      Result Value   Glucose, Bld 110 (*)    BUN 25 (*)    Creatinine, Ser 1.31 (*)    ALT 53 (*)    GFR calc non Af Amer 59 (*)    All other components within normal limits  PROTIME-INR - Abnormal; Notable for the following components:   Prothrombin Time 17.3  (*)    INR 1.4 (*)    All other components within normal limits  SURGICAL PCR SCREEN  APTT  CBC  TYPE AND SCREEN  ABO/RH     IMAGES: CT Cardiac Morph 09/08/2018 IMPRESSION: 1. There is normal pulmonary vein drainage into the left atrium.  2. The left atrial appendage is large with chicken wing morphology, two major lobes and no evidence for a thrombus.  3. The esophagus runs in the left atrial midline and is not in the proximity to any of the pulmonary veins.  4. Mildly dilated pulmonary artery measuring 32 mm suggestive of pulmonary hypertension.  5. Normal coronary origin with right dominance. The study was performed without use of NTG and insufficient for plaque evaluation. However high calcium score of 636 that represents 95 percentile for age/sex. In the presence of symptoms consider D SPECT nuclear stress test or coronary CTA/CT FFR.  EKG: 03/20/2019 Rate 73 bpm Normal sinus rhythm   CV: Myocardial Perfusion Imaging 09/30/2018  Nuclear stress EF: 61%. The left ventricular ejection fraction is normal (55-65%).  There was no ST segment deviation noted during stress.  This is a low risk study. There is no evidence of ischemia and no evidence of previous infarction  The study is normal.    Echo 06/16/2018 Summary 1. Left ventricle: The cavity size is normal.  Wall  thickness was mildly increased in a pattern of concentric left ventricular hypertrophy.  Systolic function was normal.  The estimated ejection fraction was in the rage of 55-60%.  Left ventricular diastolic function parameters were normal.  2. Aortic root: the aortic root is mildly dilated (65mm) 3. Mitral valve: there is mild regurgitation 4. Right ventricle: the cavity size is normal.  Systolic function is normal.  5. Pulmonary arteries: systolic pressure was within the normal range, estimated to be 27 mmHg + right atrial pressure.  Past Medical History:  Diagnosis Date  . A-fib ([place])   .  Coronary artery disease   . Dyslipidemia   . Essential hypertension, benign   . Family history of abdominal aortic aneurysm (AAA)   . FH: coronary artery disease   . GERD (gastroesophageal reflux disease)   . Gout, unspecified   . Long term (current) use of anticoagulants   . Morbid obesity ([place])   . OSA on CPAP     Past Surgical History:  Procedure Laterality Date  . ATRIAL FIBRILLATION ABLATION N/A 09/12/2018   Procedure: ATRIAL FIBRILLATION ABLATION;  Surgeon: Constance Haw, MD;  Location: [facility];  Service: Cardiovascular;  Laterality: N/A;  . CARDIOVERSION  06/30/2018  . TONSILLECTOMY      MEDICATIONS: . acetaminophen (TYLENOL) 500 MG tablet  . atenolol (TENORMIN) 50 MG tablet  . atorvastatin (LIPITOR) 40 MG tablet  . benazepril (LOTENSIN) 40 MG tablet  . cetirizine (ZYRTEC) 10 MG tablet  . Febuxostat (ULORIC) 80 MG TABS  . fenofibrate 160 MG tablet  . hydrochlorothiazide (HYDRODIURIL) 25 MG tablet  . Multiple Vitamins-Minerals (CENTRUM SILVER 50+MEN) TABS  . naphazoline-pheniramine (VISINE-A) 0.025-0.3 % ophthalmic solution  . Omega-3 Fatty Acids (FISH OIL) 1000 MG CAPS  . omeprazole (PRILOSEC) 20 MG capsule  . rivaroxaban (XARELTO) 20 MG TABS tablet   No current facility-administered medications for this encounter.      Maia Plan [facility] Pre-Surgical Testing [phone] 07/16/19  1:18 PM

## 2019-07-18 LAB — NOVEL CORONAVIRUS, NAA (HOSP ORDER, SEND-OUT TO REF LAB; TAT 18-24 HRS): SARS-CoV-2, NAA: NOT DETECTED

## 2019-07-19 MED ORDER — DEXTROSE 5 % IV SOLN
3.0000 g | INTRAVENOUS | Status: AC
  Administered 2019-07-20: 3 g via INTRAVENOUS
  Filled 2019-07-19: qty 3

## 2019-07-19 MED ORDER — BUPIVACAINE LIPOSOME 1.3 % IJ SUSP
20.0000 mL | Freq: Once | INTRAMUSCULAR | Status: DC
  Filled 2019-07-19: qty 20

## 2019-07-20 ENCOUNTER — Ambulatory Visit (HOSPITAL_COMMUNITY): Payer: BC Managed Care – PPO | Admitting: Registered Nurse

## 2019-07-20 ENCOUNTER — Encounter (HOSPITAL_COMMUNITY): Payer: Self-pay | Admitting: *Deleted

## 2019-07-20 ENCOUNTER — Ambulatory Visit (HOSPITAL_COMMUNITY): Payer: BC Managed Care – PPO | Admitting: Physician Assistant

## 2019-07-20 ENCOUNTER — Ambulatory Visit (HOSPITAL_COMMUNITY)
Admission: RE | Admit: 2019-07-20 | Discharge: 2019-07-21 | Disposition: A | Discharge status: Home / Self Care | Payer: BC Managed Care – PPO | Source: Other Acute Inpatient Hospital | Attending: Orthopedic Surgery | Admitting: Orthopedic Surgery

## 2019-07-20 ENCOUNTER — Other Ambulatory Visit: Payer: Self-pay

## 2019-07-20 ENCOUNTER — Encounter (HOSPITAL_COMMUNITY)
Admission: RE | Disposition: A | Discharge status: Home / Self Care | Payer: Self-pay | Source: Other Acute Inpatient Hospital | Attending: Orthopedic Surgery

## 2019-07-20 DIAGNOSIS — I10 Essential (primary) hypertension: Secondary | ICD-10-CM | POA: Diagnosis not present

## 2019-07-20 DIAGNOSIS — M179 Osteoarthritis of knee, unspecified: Secondary | ICD-10-CM | POA: Diagnosis present

## 2019-07-20 DIAGNOSIS — M109 Gout, unspecified: Secondary | ICD-10-CM | POA: Insufficient documentation

## 2019-07-20 DIAGNOSIS — G4730 Sleep apnea, unspecified: Secondary | ICD-10-CM | POA: Diagnosis not present

## 2019-07-20 DIAGNOSIS — E785 Hyperlipidemia, unspecified: Secondary | ICD-10-CM | POA: Diagnosis not present

## 2019-07-20 DIAGNOSIS — Z7901 Long term (current) use of anticoagulants: Secondary | ICD-10-CM | POA: Diagnosis not present

## 2019-07-20 DIAGNOSIS — Z8249 Family history of ischemic heart disease and other diseases of the circulatory system: Secondary | ICD-10-CM | POA: Diagnosis not present

## 2019-07-20 DIAGNOSIS — G4733 Obstructive sleep apnea (adult) (pediatric): Secondary | ICD-10-CM | POA: Diagnosis not present

## 2019-07-20 DIAGNOSIS — Z6839 Body mass index (BMI) 39.0-39.9, adult: Secondary | ICD-10-CM | POA: Diagnosis not present

## 2019-07-20 DIAGNOSIS — M1712 Unilateral primary osteoarthritis, left knee: Secondary | ICD-10-CM | POA: Insufficient documentation

## 2019-07-20 DIAGNOSIS — M1710 Unilateral primary osteoarthritis, unspecified knee: Secondary | ICD-10-CM | POA: Diagnosis present

## 2019-07-20 DIAGNOSIS — Z79899 Other long term (current) drug therapy: Secondary | ICD-10-CM | POA: Diagnosis not present

## 2019-07-20 DIAGNOSIS — K219 Gastro-esophageal reflux disease without esophagitis: Secondary | ICD-10-CM | POA: Diagnosis not present

## 2019-07-20 DIAGNOSIS — I2510 Atherosclerotic heart disease of native coronary artery without angina pectoris: Secondary | ICD-10-CM | POA: Diagnosis not present

## 2019-07-20 HISTORY — PX: TOTAL KNEE ARTHROPLASTY: SHX125

## 2019-07-20 LAB — TYPE AND SCREEN
ABO/RH(D): A POS
Antibody Screen: NEGATIVE

## 2019-07-20 SURGERY — ARTHROPLASTY, KNEE, TOTAL
Anesthesia: Spinal | Site: Knee | Laterality: Left

## 2019-07-20 MED ORDER — ONDANSETRON HCL 4 MG/2ML IJ SOLN
INTRAMUSCULAR | Status: AC
  Filled 2019-07-20: qty 2

## 2019-07-20 MED ORDER — CEFAZOLIN SODIUM-DEXTROSE 2-4 GM/100ML-% IV SOLN
2.0000 g | Freq: Four times a day (QID) | INTRAVENOUS | Status: AC
  Administered 2019-07-20 (×2): 2 g via INTRAVENOUS
  Filled 2019-07-20 (×2): qty 100

## 2019-07-20 MED ORDER — ATORVASTATIN CALCIUM 20 MG PO TABS
20.0000 mg | ORAL_TABLET | Freq: Every day | ORAL | Status: DC
  Administered 2019-07-21: 20 mg via ORAL
  Filled 2019-07-20: qty 1

## 2019-07-20 MED ORDER — BISACODYL 10 MG RE SUPP: 10.0000 mg | Freq: Every day | RECTAL | Status: DC | PRN

## 2019-07-20 MED ORDER — FLEET ENEMA 7-19 GM/118ML RE ENEM: 1.0000 | ENEMA | Freq: Once | RECTAL | Status: DC | PRN

## 2019-07-20 MED ORDER — GABAPENTIN 300 MG PO CAPS
300.0000 mg | ORAL_CAPSULE | Freq: Three times a day (TID) | ORAL | Status: DC
  Administered 2019-07-20 – 2019-07-21 (×3): 300 mg via ORAL
  Filled 2019-07-20 (×3): qty 1

## 2019-07-20 MED ORDER — POVIDONE-IODINE 10 % EX SWAB
2.0000 "application " | Freq: Once | CUTANEOUS | Status: AC
  Administered 2019-07-20: 2 via TOPICAL

## 2019-07-20 MED ORDER — MIDAZOLAM HCL 5 MG/5ML IJ SOLN
INTRAMUSCULAR | Status: DC | PRN
  Administered 2019-07-20: 2 mg via INTRAVENOUS

## 2019-07-20 MED ORDER — PANTOPRAZOLE SODIUM 40 MG PO TBEC
40.0000 mg | DELAYED_RELEASE_TABLET | Freq: Every day | ORAL | Status: DC
  Administered 2019-07-21: 40 mg via ORAL
  Filled 2019-07-20: qty 1

## 2019-07-20 MED ORDER — PROPOFOL 10 MG/ML IV BOLUS
INTRAVENOUS | Status: AC
  Filled 2019-07-20: qty 20

## 2019-07-20 MED ORDER — MORPHINE SULFATE (PF) 2 MG/ML IV SOLN: 1.0000 mg | INTRAVENOUS | Status: DC | PRN

## 2019-07-20 MED ORDER — SODIUM CHLORIDE (PF) 0.9 % IJ SOLN
INTRAMUSCULAR | Status: AC
  Filled 2019-07-20: qty 50

## 2019-07-20 MED ORDER — BUPIVACAINE IN DEXTROSE 0.75-8.25 % IT SOLN
INTRATHECAL | Status: DC | PRN
  Administered 2019-07-20: 1.6 mL via INTRATHECAL

## 2019-07-20 MED ORDER — BUPIVACAINE LIPOSOME 1.3 % IJ SUSP
INTRAMUSCULAR | Status: DC | PRN
  Administered 2019-07-20: 20 mL

## 2019-07-20 MED ORDER — RIVAROXABAN 10 MG PO TABS
10.0000 mg | ORAL_TABLET | Freq: Every day | ORAL | Status: DC
  Administered 2019-07-21: 10 mg via ORAL
  Filled 2019-07-20: qty 1

## 2019-07-20 MED ORDER — METHOCARBAMOL 500 MG IVPB - SIMPLE MED
500.0000 mg | Freq: Four times a day (QID) | INTRAVENOUS | Status: DC | PRN
  Filled 2019-07-20: qty 50

## 2019-07-20 MED ORDER — PROPOFOL 10 MG/ML IV BOLUS
INTRAVENOUS | Status: AC
  Filled 2019-07-20: qty 60

## 2019-07-20 MED ORDER — METHOCARBAMOL 500 MG PO TABS: 500.0000 mg | ORAL_TABLET | Freq: Four times a day (QID) | ORAL | Status: DC | PRN

## 2019-07-20 MED ORDER — MIDAZOLAM HCL 2 MG/2ML IJ SOLN
INTRAMUSCULAR | Status: AC
  Filled 2019-07-20: qty 2

## 2019-07-20 MED ORDER — SODIUM CHLORIDE (PF) 0.9 % IJ SOLN
INTRAMUSCULAR | Status: AC
  Filled 2019-07-20: qty 10

## 2019-07-20 MED ORDER — PROPOFOL 500 MG/50ML IV EMUL
INTRAVENOUS | Status: DC | PRN
  Administered 2019-07-20: 75 ug/kg/min via INTRAVENOUS

## 2019-07-20 MED ORDER — PROMETHAZINE HCL 25 MG/ML IJ SOLN: 6.2500 mg | INTRAMUSCULAR | Status: DC | PRN

## 2019-07-20 MED ORDER — METOCLOPRAMIDE HCL 5 MG/ML IJ SOLN: 5.0000 mg | Freq: Three times a day (TID) | INTRAMUSCULAR | Status: DC | PRN

## 2019-07-20 MED ORDER — POLYETHYLENE GLYCOL 3350 17 G PO PACK: 17.0000 g | PACK | Freq: Every day | ORAL | Status: DC | PRN

## 2019-07-20 MED ORDER — ACETAMINOPHEN 10 MG/ML IV SOLN
1000.0000 mg | Freq: Four times a day (QID) | INTRAVENOUS | Status: DC
  Administered 2019-07-20: 1000 mg via INTRAVENOUS
  Filled 2019-07-20: qty 100

## 2019-07-20 MED ORDER — LACTATED RINGERS IV SOLN
INTRAVENOUS | Status: DC
  Administered 2019-07-20 (×4): via INTRAVENOUS

## 2019-07-20 MED ORDER — FENTANYL CITRATE (PF) 100 MCG/2ML IJ SOLN
INTRAMUSCULAR | Status: AC
  Filled 2019-07-20: qty 2

## 2019-07-20 MED ORDER — OXYCODONE HCL 5 MG PO TABS
5.0000 mg | ORAL_TABLET | ORAL | Status: DC | PRN
  Administered 2019-07-20 (×2): 5 mg via ORAL
  Filled 2019-07-20: qty 2
  Filled 2019-07-20: qty 1
  Filled 2019-07-20: qty 2

## 2019-07-20 MED ORDER — BENAZEPRIL HCL 20 MG PO TABS
40.0000 mg | ORAL_TABLET | Freq: Every day | ORAL | Status: DC
  Administered 2019-07-21: 40 mg via ORAL
  Filled 2019-07-20: qty 2

## 2019-07-20 MED ORDER — LORATADINE 10 MG PO TABS: 10.0000 mg | ORAL_TABLET | Freq: Every day | ORAL | Status: DC

## 2019-07-20 MED ORDER — FENTANYL CITRATE (PF) 100 MCG/2ML IJ SOLN
INTRAMUSCULAR | Status: DC | PRN
  Administered 2019-07-20: 100 ug via INTRAVENOUS

## 2019-07-20 MED ORDER — DEXAMETHASONE SODIUM PHOSPHATE 10 MG/ML IJ SOLN
INTRAMUSCULAR | Status: AC
  Filled 2019-07-20: qty 1

## 2019-07-20 MED ORDER — SODIUM CHLORIDE (PF) 0.9 % IJ SOLN
INTRAMUSCULAR | Status: DC | PRN
  Administered 2019-07-20: 60 mL

## 2019-07-20 MED ORDER — DOCUSATE SODIUM 100 MG PO CAPS
100.0000 mg | ORAL_CAPSULE | Freq: Two times a day (BID) | ORAL | Status: DC
  Administered 2019-07-20 – 2019-07-21 (×2): 100 mg via ORAL
  Filled 2019-07-20 (×2): qty 1

## 2019-07-20 MED ORDER — TRANEXAMIC ACID-NACL 1000-0.7 MG/100ML-% IV SOLN
1000.0000 mg | INTRAVENOUS | Status: AC
  Administered 2019-07-20: 1000 mg via INTRAVENOUS
  Filled 2019-07-20: qty 100

## 2019-07-20 MED ORDER — METOCLOPRAMIDE HCL 5 MG PO TABS: 5.0000 mg | ORAL_TABLET | Freq: Three times a day (TID) | ORAL | Status: DC | PRN

## 2019-07-20 MED ORDER — SODIUM CHLORIDE 0.9 % IR SOLN
Status: DC | PRN
  Administered 2019-07-20: 1000 mL

## 2019-07-20 MED ORDER — ROPIVACAINE HCL 5 MG/ML IJ SOLN
INTRAMUSCULAR | Status: DC | PRN
  Administered 2019-07-20: 20 mL via PERINEURAL

## 2019-07-20 MED ORDER — OXYCODONE HCL 5 MG PO TABS
10.0000 mg | ORAL_TABLET | ORAL | Status: DC | PRN
  Administered 2019-07-21: 10 mg via ORAL

## 2019-07-20 MED ORDER — CHLORHEXIDINE GLUCONATE 4 % EX LIQD: 60.0000 mL | Freq: Once | CUTANEOUS | Status: DC

## 2019-07-20 MED ORDER — MENTHOL 3 MG MT LOZG: 1.0000 | LOZENGE | OROMUCOSAL | Status: DC | PRN

## 2019-07-20 MED ORDER — ONDANSETRON HCL 4 MG/2ML IJ SOLN
INTRAMUSCULAR | Status: DC | PRN
  Administered 2019-07-20: 4 mg via INTRAVENOUS

## 2019-07-20 MED ORDER — PHENYLEPHRINE HCL-NACL 10-0.9 MG/250ML-% IV SOLN
INTRAVENOUS | Status: DC | PRN
  Administered 2019-07-20: 25 ug/min via INTRAVENOUS

## 2019-07-20 MED ORDER — DEXAMETHASONE SODIUM PHOSPHATE 10 MG/ML IJ SOLN
10.0000 mg | Freq: Once | INTRAMUSCULAR | Status: AC
  Administered 2019-07-21: 10 mg via INTRAVENOUS
  Filled 2019-07-20: qty 1

## 2019-07-20 MED ORDER — DEXAMETHASONE SODIUM PHOSPHATE 10 MG/ML IJ SOLN
8.0000 mg | Freq: Once | INTRAMUSCULAR | Status: AC
  Administered 2019-07-20: 10 mg via INTRAVENOUS

## 2019-07-20 MED ORDER — PHENOL 1.4 % MT LIQD: 1.0000 | OROMUCOSAL | Status: DC | PRN

## 2019-07-20 MED ORDER — MIDAZOLAM HCL 2 MG/2ML IJ SOLN
1.0000 mg | Freq: Once | INTRAMUSCULAR | Status: AC
  Administered 2019-07-20: 2 mg via INTRAVENOUS
  Filled 2019-07-20: qty 2

## 2019-07-20 MED ORDER — FEBUXOSTAT 40 MG PO TABS
80.0000 mg | ORAL_TABLET | Freq: Every day | ORAL | Status: DC
  Administered 2019-07-21: 80 mg via ORAL
  Filled 2019-07-20: qty 2

## 2019-07-20 MED ORDER — SODIUM CHLORIDE 0.9 % IV SOLN
INTRAVENOUS | Status: DC
  Administered 2019-07-20: 13:00:00 via INTRAVENOUS

## 2019-07-20 MED ORDER — ATENOLOL 50 MG PO TABS
50.0000 mg | ORAL_TABLET | Freq: Every day | ORAL | Status: DC
  Administered 2019-07-21: 50 mg via ORAL
  Filled 2019-07-20: qty 1

## 2019-07-20 MED ORDER — HYDROMORPHONE HCL 1 MG/ML IJ SOLN: 0.2500 mg | INTRAMUSCULAR | Status: DC | PRN

## 2019-07-20 MED ORDER — FENTANYL CITRATE (PF) 100 MCG/2ML IJ SOLN
50.0000 ug | Freq: Once | INTRAMUSCULAR | Status: AC
  Administered 2019-07-20: 100 ug via INTRAVENOUS
  Filled 2019-07-20: qty 2

## 2019-07-20 MED ORDER — HYDROCHLOROTHIAZIDE 25 MG PO TABS
25.0000 mg | ORAL_TABLET | Freq: Every day | ORAL | Status: DC
  Administered 2019-07-21: 25 mg via ORAL
  Filled 2019-07-20: qty 1

## 2019-07-20 MED ORDER — DIPHENHYDRAMINE HCL 12.5 MG/5ML PO ELIX: 12.5000 mg | ORAL_SOLUTION | ORAL | Status: DC | PRN

## 2019-07-20 MED ORDER — ACETAMINOPHEN 500 MG PO TABS
1000.0000 mg | ORAL_TABLET | Freq: Four times a day (QID) | ORAL | Status: AC
  Administered 2019-07-20 – 2019-07-21 (×4): 1000 mg via ORAL
  Filled 2019-07-20 (×4): qty 2

## 2019-07-20 MED ORDER — ONDANSETRON HCL 4 MG/2ML IJ SOLN: 4.0000 mg | Freq: Four times a day (QID) | INTRAMUSCULAR | Status: DC | PRN

## 2019-07-20 MED ORDER — ONDANSETRON HCL 4 MG PO TABS: 4.0000 mg | ORAL_TABLET | Freq: Four times a day (QID) | ORAL | Status: DC | PRN

## 2019-07-20 SURGICAL SUPPLY — 58 items
ATTUNE MED DOME PAT 41 KNEE (Knees) ×2 IMPLANT
ATTUNE PS FEM LT SZ 8 CEM KNEE (Femur) ×2 IMPLANT
ATTUNE PSRP INSR SZ8 8 KNEE (Insert) ×2 IMPLANT
BAG ZIPLOCK 12X15 (MISCELLANEOUS) ×2 IMPLANT
BASE TIBIAL ROT PLAT SZ 7 KNEE (Knees) ×1 IMPLANT
BLADE SAG 18X100X1.27 (BLADE) ×2 IMPLANT
BLADE SAW SGTL 11.0X1.19X90.0M (BLADE) ×2 IMPLANT
BLADE SURG SZ10 CARB STEEL (BLADE) ×4 IMPLANT
BNDG ELASTIC 6X5.8 VLCR STR LF (GAUZE/BANDAGES/DRESSINGS) ×2 IMPLANT
BOWL SMART MIX CTS (DISPOSABLE) ×2 IMPLANT
CEMENT HV SMART SET (Cement) ×4 IMPLANT
COVER SURGICAL LIGHT HANDLE (MISCELLANEOUS) ×2 IMPLANT
COVER WAND RF STERILE (DRAPES) IMPLANT
CUFF TOURN SGL QUICK 34 (TOURNIQUET CUFF) ×1
CUFF TRNQT CYL 34X4.125X (TOURNIQUET CUFF) ×1 IMPLANT
DECANTER SPIKE VIAL GLASS SM (MISCELLANEOUS) ×2 IMPLANT
DRAPE U-SHAPE 47X51 STRL (DRAPES) ×2 IMPLANT
DRSG ADAPTIC 3X8 NADH LF (GAUZE/BANDAGES/DRESSINGS) ×2 IMPLANT
DRSG PAD ABDOMINAL 8X10 ST (GAUZE/BANDAGES/DRESSINGS) ×2 IMPLANT
DURAPREP 26ML APPLICATOR (WOUND CARE) ×2 IMPLANT
ELECT REM PT RETURN 15FT ADLT (MISCELLANEOUS) ×2 IMPLANT
EVACUATOR 1/8 PVC DRAIN (DRAIN) ×2 IMPLANT
GAUZE SPONGE 4X4 12PLY STRL (GAUZE/BANDAGES/DRESSINGS) ×2 IMPLANT
GLOVE BIO SURGEON STRL SZ7 (GLOVE) ×2 IMPLANT
GLOVE BIO SURGEON STRL SZ8 (GLOVE) ×2 IMPLANT
GLOVE BIOGEL PI IND STRL 6.5 (GLOVE) ×1 IMPLANT
GLOVE BIOGEL PI IND STRL 7.0 (GLOVE) ×1 IMPLANT
GLOVE BIOGEL PI IND STRL 8 (GLOVE) ×1 IMPLANT
GLOVE BIOGEL PI INDICATOR 6.5 (GLOVE) ×1
GLOVE BIOGEL PI INDICATOR 7.0 (GLOVE) ×1
GLOVE BIOGEL PI INDICATOR 8 (GLOVE) ×1
GLOVE SURG SS PI 6.5 STRL IVOR (GLOVE) ×2 IMPLANT
GOWN STRL REUS W/TWL LRG LVL3 (GOWN DISPOSABLE) ×6 IMPLANT
HANDPIECE INTERPULSE COAX TIP (DISPOSABLE) ×1
HOLDER FOLEY CATH W/STRAP (MISCELLANEOUS) IMPLANT
IMMOBILIZER KNEE 20 (SOFTGOODS) ×2
IMMOBILIZER KNEE 20 THIGH 36 (SOFTGOODS) ×1 IMPLANT
IMMOBILIZER KNEE 22 UNIV (SOFTGOODS) ×2 IMPLANT
KIT TURNOVER KIT A (KITS) IMPLANT
MANIFOLD NEPTUNE II (INSTRUMENTS) ×2 IMPLANT
NS IRRIG 1000ML POUR BTL (IV SOLUTION) ×2 IMPLANT
PACK TOTAL KNEE CUSTOM (KITS) ×2 IMPLANT
PADDING CAST COTTON 6X4 STRL (CAST SUPPLIES) ×2 IMPLANT
PIN DRILL FIX HALF THREAD (BIT) ×2 IMPLANT
PIN STEINMAN FIXATION KNEE (PIN) ×2 IMPLANT
PROTECTOR NERVE ULNAR (MISCELLANEOUS) ×2 IMPLANT
SET HNDPC FAN SPRY TIP SCT (DISPOSABLE) ×1 IMPLANT
STRIP CLOSURE SKIN 1/2X4 (GAUZE/BANDAGES/DRESSINGS) ×2 IMPLANT
SUT MNCRL AB 4-0 PS2 18 (SUTURE) ×2 IMPLANT
SUT STRATAFIX 0 PDS 27 VIOLET (SUTURE) ×2
SUT VIC AB 2-0 CT1 27 (SUTURE) ×3
SUT VIC AB 2-0 CT1 TAPERPNT 27 (SUTURE) ×3 IMPLANT
SUTURE STRATFX 0 PDS 27 VIOLET (SUTURE) ×1 IMPLANT
TIBIAL BASE ROT PLAT SZ 7 KNEE (Knees) ×2 IMPLANT
TRAY FOLEY MTR SLVR 16FR STAT (SET/KITS/TRAYS/PACK) ×2 IMPLANT
WATER STERILE IRR 1000ML POUR (IV SOLUTION) ×4 IMPLANT
WRAP KNEE MAXI GEL POST OP (GAUZE/BANDAGES/DRESSINGS) ×2 IMPLANT
YANKAUER SUCT BULB TIP 10FT TU (MISCELLANEOUS) ×2 IMPLANT

## 2019-07-20 NOTE — Plan of Care (Signed)
Plan of care 

## 2019-07-20 NOTE — Anesthesia Procedure Notes (Signed)
Procedure Name: MAC Date/Time: 07/20/2019 8:10 AM Performed by: Lissa Morales, CRNA Pre-anesthesia Checklist: Patient identified, Emergency Drugs available, Patient being monitored, Timeout performed and Suction available Patient Re-evaluated:Patient Re-evaluated prior to induction Oxygen Delivery Method: Simple face mask Placement Confirmation: positive ETCO2

## 2019-07-20 NOTE — Interval H&P Note (Signed)
History and Physical Interval Note:  07/20/2019 6:45 AM  Austin Barker  has presented today for surgery, with the diagnosis of Left knee osteoarthritis.  The various methods of treatment have been discussed with the patient and family. After consideration of risks, benefits and other options for treatment, the patient has consented to  Procedure(s) with comments: TOTAL KNEE ARTHROPLASTY (Left) - 24min as a surgical intervention.  The patient's history has been reviewed, patient examined, no change in status, stable for surgery.  I have reviewed the patient's chart and labs.  Questions were answered to the patient's satisfaction.     Pilar Plate Temisha Murley

## 2019-07-20 NOTE — Evaluation (Signed)
Physical Therapy Evaluation Patient Details Name: Austin Barker MRN: [id number] DOB: 03-Apr-1961 Today's Date: 07/20/2019   History of Present Illness  s/p L TKA PMH: afib on anitcoagulants, HTN  Clinical Impression  Pt is s/p TKA resulting in the deficits listed below (see PT Problem List).  Pt amb 28' with RW  And min/guard assist.  Anticipate steady progress    Pt will benefit from skilled PT to increase their independence and safety with mobility to allow discharge to the venue listed below.      Follow Up Recommendations Follow surgeon's recommendation for DC plan and follow-up therapies    Equipment Recommendations  None recommended by PT    Recommendations for Other Services       Precautions / Restrictions Precautions Precautions: Fall;Knee Required Braces or Orthoses: Knee Immobilizer - Left Knee Immobilizer - Left: Discontinue once straight leg raise with < 10 degree lag Restrictions Weight Bearing Restrictions: No Other Position/Activity Restrictions: WBAT      Mobility  Bed Mobility Overal bed mobility: Needs Assistance Bed Mobility: Supine to Sit     Supine to sit: Min guard;Min assist     General bed mobility comments: for safety, to elevate trunk  Transfers Overall transfer level: Needs assistance Equipment used: Rolling walker (2 wheeled) Transfers: Sit to/from Stand Sit to Stand: Min assist;From elevated surface         General transfer comment: cues for hand placement and LLE position  Ambulation/Gait Ambulation/Gait assistance: Min assist;Min guard Gait Distance (Feet): 80 Feet Assistive device: Rolling walker (2 wheeled) Gait Pattern/deviations: Step-to pattern;Decreased stance time - left     General Gait Details: cues for RW position, sequence  Stairs            Wheelchair Mobility    Modified Rankin (Stroke Patients Only)       Balance                                             Pertinent  Vitals/Pain Pain Assessment: 0-10 Pain Score: 4  Pain Location: left knee Pain Descriptors / Indicators: Grimacing;Sore Pain Intervention(s): Limited activity within patient's tolerance;Monitored during session;Repositioned;Premedicated before session;Ice applied    Home Living Family/patient expects to be discharged to:: Private residence Living Arrangements: Spouse/significant other Available Help at Discharge: Family Type of Home: House Home Access: Ramped entrance(chair lift to get up to second level)     Home Layout: One level Home Equipment: Environmental consultant - 2 wheels;Cane - single point      Prior Function Level of Independence: Independent               Hand Dominance        Extremity/Trunk Assessment   Upper Extremity Assessment Upper Extremity Assessment: Overall WFL for tasks assessed    Lower Extremity Assessment Lower Extremity Assessment: LLE deficits/detail LLE Deficits / Details: ankle WFL, knee extension and hip flexion 2+/5, limited by post op pain and weakness       Communication   Communication: No difficulties  Cognition Arousal/Alertness: Awake/alert Behavior During Therapy: WFL for tasks assessed/performed Overall Cognitive Status: Within Functional Limits for tasks assessed                                        General Comments  Exercises Total Joint Exercises Ankle Circles/Pumps: AROM;Both;10 reps Quad Sets: AROM;Both;5 reps Straight Leg Raises: AROM;5 reps;Left(with KI in place)   Assessment/Plan    PT Assessment Patient needs continued PT services  PT Problem List Decreased strength;Decreased range of motion;Decreased activity tolerance;Decreased balance;Decreased knowledge of use of DME;Decreased mobility;Pain       PT Treatment Interventions DME instruction;Gait training;Functional mobility training;Therapeutic activities;Patient/family education;Therapeutic exercise    PT Goals (Current goals can be found  in the Care Plan section)  Acute Rehab PT Goals Patient Stated Goal: to get back to work PT Goal Formulation: With patient Time For Goal Achievement: 07/27/19 Potential to Achieve Goals: Good    Frequency 7X/week   Barriers to discharge        Co-evaluation               AM-PAC PT "6 Clicks" Mobility  Outcome Measure Help needed turning from your back to your side while in a flat bed without using bedrails?: A Little Help needed moving from lying on your back to sitting on the side of a flat bed without using bedrails?: A Little Help needed moving to and from a bed to a chair (including a wheelchair)?: A Little Help needed standing up from a chair using your arms (e.g., wheelchair or bedside chair)?: A Little Help needed to walk in hospital room?: A Little Help needed climbing 3-5 steps with a railing? : A Lot 6 Click Score: 17    End of Session Equipment Utilized During Treatment: Gait belt;Left knee immobilizer Activity Tolerance: Patient tolerated treatment well Patient left: in chair;with chair alarm set;with call bell/phone within reach   PT Visit Diagnosis: Difficulty in walking, not elsewhere classified (R26.2)    Time: 8295-6213 PT Time Calculation (min) (ACUTE ONLY): 21 min   Charges:   PT Evaluation $PT Eval Low Complexity: 1 Low          Drucilla Chalet, PT  Pager: [phone] Acute Rehab Dept [facility]): [phone]   07/20/2019   [facility] 07/20/2019, 5:20 PM

## 2019-07-20 NOTE — Discharge Instructions (Addendum)
° °Dr. Frank Aluisio °Total Joint Specialist °[facility] °[address] °[place], [place] [address] °[phone] ° °TOTAL KNEE REPLACEMENT POSTOPERATIVE DIRECTIONS ° °Knee Rehabilitation, Guidelines Following Surgery  °Results after knee surgery are often greatly improved when you follow the exercise, range of motion and muscle strengthening exercises prescribed by your doctor. Safety measures are also important to protect the knee from further injury. Any time any of these exercises cause you to have increased pain or swelling in your knee joint, decrease the amount until you are comfortable again and slowly increase them. If you have problems or questions, call your caregiver or physical therapist for advice.  ° °HOME CARE INSTRUCTIONS  °• Remove items at home which could result in a fall. This includes throw rugs or furniture in walking pathways.  °· ICE to the affected knee every three hours for 30 minutes at a time and then as needed for pain and swelling.  Continue to use ice on the knee for pain and swelling from surgery. You may notice swelling that will progress down to the foot and ankle.  This is normal after surgery.  Elevate the leg when you are not up walking on it.   °· Continue to use the breathing machine which will help keep your temperature down.  It is common for your temperature to cycle up and down following surgery, especially at night when you are not up moving around and exerting yourself.  The breathing machine keeps your lungs expanded and your temperature down. °· Do not place pillow under knee, focus on keeping the knee straight while resting ° °DIET °You may resume your previous home diet once your are discharged from the hospital. ° °DRESSING / WOUND CARE / SHOWERING °You may change your dressing 3-5 days after surgery.  Then change the dressing every day with sterile gauze.  Please use good hand washing techniques before changing the dressing.  Do not use any lotions  or creams on the incision until instructed by your surgeon. °You may start showering once you are discharged home but do not submerge the incision under water. Just pat the incision dry and apply a dry gauze dressing on daily. °Change the surgical dressing daily and reapply a dry dressing each time. ° °ACTIVITY °Walk with your walker as instructed. °Use walker as long as suggested by your caregivers. °Avoid periods of inactivity such as sitting longer than an hour when not asleep. This helps prevent blood clots.  °You may resume a sexual relationship in one month or when given the OK by your doctor.  °You may return to work once you are cleared by your doctor.  °Do not drive a car for 6 weeks or until released by you surgeon.  °Do not drive while taking narcotics. ° °WEIGHT BEARING °Weight bearing as tolerated with assist device (walker, cane, etc) as directed, use it as long as suggested by your surgeon or therapist, typically at least 4-6 weeks. ° °POSTOPERATIVE CONSTIPATION PROTOCOL °Constipation - defined medically as fewer than three stools per week and severe constipation as less than one stool per week. ° °One of the most common issues patients have following surgery is constipation.  Even if you have a regular bowel pattern at home, your normal regimen is likely to be disrupted due to multiple reasons following surgery.  Combination of anesthesia, postoperative narcotics, change in appetite and fluid intake all can affect your bowels.  In order to avoid complications following surgery, here are some   recommendations in order to help you during your recovery period. ° °Colace (docusate) - Pick up an over-the-counter form of Colace or another stool softener and take twice a day as long as you are requiring postoperative pain medications.  Take with a full glass of water daily.  If you experience loose stools or diarrhea, hold the colace until you stool forms back up.  If your symptoms do not get better within 1  week or if they get worse, check with your doctor. ° °Dulcolax (bisacodyl) - Pick up over-the-counter and take as directed by the product packaging as needed to assist with the movement of your bowels.  Take with a full glass of water.  Use this product as needed if not relieved by Colace only.  ° °MiraLax (polyethylene glycol) - Pick up over-the-counter to have on hand.  MiraLax is a solution that will increase the amount of water in your bowels to assist with bowel movements.  Take as directed and can mix with a glass of water, juice, soda, coffee, or tea.  Take if you go more than two days without a movement. °Do not use MiraLax more than once per day. Call your doctor if you are still constipated or irregular after using this medication for 7 days in a row. ° °If you continue to have problems with postoperative constipation, please contact the office for further assistance and recommendations.  If you experience "the worst abdominal pain ever" or develop nausea or vomiting, please contact the office immediatly for further recommendations for treatment. ° °ITCHING °If you experience itching with your medications, try taking only a single pain pill, or even half a pain pill at a time.  You can also use Benadryl over the counter for itching or also to help with sleep.  ° °TED HOSE STOCKINGS °Wear the elastic stockings on both legs for three weeks following surgery during the day but you may remove then at night for sleeping. ° °MEDICATIONS °See your medication summary on the “After Visit Summary” that the nursing staff will review with you prior to discharge.  You may have some home medications which will be placed on hold until you complete the course of blood thinner medication.  It is important for you to complete the blood thinner medication as prescribed by your surgeon.  Continue your approved medications as instructed at time of discharge. ° °Gabapentin 300 mg Protocol °Take a 300 mg capsule three times a  day for two weeks, °Then a 300 mg capsule twice a day for two weeks, °Then a 300 mg capsule once a day for two weeks, then discontinue the Gabapentin. ° °PRECAUTIONS °If you experience chest pain or shortness of breath - call 911 immediately for transfer to the hospital emergency department.  °If you develop a fever greater that 101 F, purulent drainage from wound, increased redness or drainage from wound, foul odor from the wound/dressing, or calf pain - CONTACT YOUR SURGEON.   °                                                °FOLLOW-UP APPOINTMENTS °Make sure you keep all of your appointments after your operation with your surgeon and caregivers. You should call the office at the above phone number and make an appointment for approximately two weeks after the date of your surgery or on   the date instructed by your surgeon outlined in the "After Visit Summary". ° °RANGE OF MOTION AND STRENGTHENING EXERCISES  °Rehabilitation of the knee is important following a knee injury or an operation. After just a few days of immobilization, the muscles of the thigh which control the knee become weakened and shrink (atrophy). Knee exercises are designed to build up the tone and strength of the thigh muscles and to improve knee motion. Often times heat used for twenty to thirty minutes before working out will loosen up your tissues and help with improving the range of motion but do not use heat for the first two weeks following surgery. These exercises can be done on a training (exercise) mat, on the floor, on a table or on a bed. Use what ever works the best and is most comfortable for you Knee exercises include:  °• Leg Lifts - While your knee is still immobilized in a splint or cast, you can do straight leg raises. Lift the leg to 60 degrees, hold for 3 sec, and slowly lower the leg. Repeat 10-20 times 2-3 times daily. Perform this exercise against resistance later as your knee gets better.  °• Quad and Hamstring Sets -  Tighten up the muscle on the front of the thigh (Quad) and hold for 5-10 sec. Repeat this 10-20 times hourly. Hamstring sets are done by pushing the foot backward against an object and holding for 5-10 sec. Repeat as with quad sets.  °· Leg Slides: Lying on your back, slowly slide your foot toward your buttocks, bending your knee up off the floor (only go as far as is comfortable). Then slowly slide your foot back down until your leg is flat on the floor again. °· Angel Wings: Lying on your back spread your legs to the side as far apart as you can without causing discomfort.  °A rehabilitation program following serious knee injuries can speed recovery and prevent re-injury in the future due to weakened muscles. Contact your doctor or a physical therapist for more information on knee rehabilitation.  ° °IF YOU ARE TRANSFERRED TO A SKILLED REHAB FACILITY °If the patient is transferred to a skilled rehab facility following release from the hospital, a list of the current medications will be sent to the facility for the patient to continue.  When discharged from the skilled rehab facility, please have the facility set up the patient's Home Health Physical Therapy prior to being released. Also, the skilled facility will be responsible for providing the patient with their medications at time of release from the facility to include their pain medication, the muscle relaxants, and their blood thinner medication. If the patient is still at the rehab facility at time of the two week follow up appointment, the skilled rehab facility will also need to assist the patient in arranging follow up appointment in our office and any transportation needs. ° °MAKE SURE YOU:  °• Understand these instructions.  °• Get help right away if you are not doing well or get worse.  ° ° °Pick up stool softner and laxative for home use following surgery while on pain medications. °Do not submerge incision under water. °Please use good hand washing  techniques while changing dressing each day. °May shower starting three days after surgery. °Please use a clean towel to pat the incision dry following showers. °Continue to use ice for pain and swelling after surgery. °Do not use any lotions or creams on the incision until instructed by your surgeon. ° °

## 2019-07-20 NOTE — Anesthesia Postprocedure Evaluation (Signed)
Anesthesia Post Note  Patient: Austin Barker  Procedure(s) Performed: TOTAL KNEE ARTHROPLASTY (Left Knee)     Patient location during evaluation: PACU Anesthesia Type: Spinal Level of consciousness: oriented and awake and alert Pain management: pain level controlled Vital Signs Assessment: post-procedure vital signs reviewed and stable Respiratory status: spontaneous breathing, respiratory function stable and patient connected to nasal cannula oxygen Cardiovascular status: blood pressure returned to baseline and stable Postop Assessment: no headache, no backache and no apparent nausea or vomiting Anesthetic complications: no    Last Vitals:  Vitals:   07/20/19 1015 07/20/19 1030  BP: 112/75 122/76  Pulse: 75 77  Resp: 13 16  Temp:  36.6 C  SpO2: 97% 97%    Last Pain:  Vitals:   07/20/19 1030  TempSrc:   PainSc: 0-No pain                 Zahir Eisenhour S

## 2019-07-20 NOTE — Progress Notes (Signed)
Assisted Dr. Rose with right, ultrasound guided, adductor canal block. Side rails up, monitors on throughout procedure. See vital signs in flow sheet. Tolerated Procedure well.  

## 2019-07-20 NOTE — Anesthesia Procedure Notes (Signed)
Spinal  Patient location during procedure: OR End time: 07/20/2019 8:19 AM Staffing Resident/CRNA: Lissa Morales, CRNA Preanesthetic Checklist Completed: patient identified, site marked, surgical consent, pre-op evaluation, timeout performed, IV checked, risks and benefits discussed and monitors and equipment checked Spinal Block Patient position: sitting Prep: DuraPrep Patient monitoring: heart rate, continuous pulse ox and blood pressure Approach: midline Location: L3-4 Injection technique: single-shot Needle Needle type: Pencan  Needle gauge: 24 G Needle length: 9 cm Additional Notes Expiration date of kit checked and confirmed. Patient tolerated procedure well, without complications.

## 2019-07-20 NOTE — Transfer of Care (Signed)
Immediate Anesthesia Transfer of Care Note  Patient: ROBERTT BUDA  Procedure(s) Performed: TOTAL KNEE ARTHROPLASTY (Left Knee)  Patient Location: PACU  Anesthesia Type:Spinal  Level of Consciousness: awake, alert , oriented and patient cooperative  Airway & Oxygen Therapy: Patient Spontanous Breathing and Patient connected to face mask oxygen  Post-op Assessment: Report given to RN and Post -op Vital signs reviewed and stable  Post vital signs: stable  Last Vitals:  Vitals Value Taken Time  BP 125/81 07/20/19 0953  Temp    Pulse 80 07/20/19 0955  Resp 19 07/20/19 0955  SpO2 99 % 07/20/19 0955  Vitals shown include unvalidated device data.  Last Pain:  Vitals:   07/20/19 0640  TempSrc: Oral         Complications: No apparent anesthesia complications

## 2019-07-20 NOTE — Anesthesia Procedure Notes (Signed)
Anesthesia Regional Block: Adductor canal block   Pre-Anesthetic Checklist: ,, timeout performed, Correct Patient, Correct Site, Correct Laterality, Correct Procedure, Correct Position, site marked, Risks and benefits discussed,  Surgical consent,  Pre-op evaluation,  At surgeon's request and post-op pain management  Laterality: Left  Prep: chloraprep       Needles:  Injection technique: Single-shot  Needle Type: Echogenic Needle     Needle Length: 9cm      Additional Needles:   Procedures:,,,, ultrasound used (permanent image in chart),,,,  Narrative:  Start time: 07/20/2019 7:20 AM End time: 07/20/2019 7:26 AM Injection made incrementally with aspirations every 5 mL.  Performed by: Personally  Anesthesiologist: Myrtie Soman, MD  Additional Notes: Patient tolerated the procedure well without complications

## 2019-07-20 NOTE — Plan of Care (Signed)
Plan of care reviewed and discussed with the patient. 

## 2019-07-20 NOTE — Anesthesia Procedure Notes (Signed)
Anesthesia Procedure Image    

## 2019-07-20 NOTE — Op Note (Signed)
OPERATIVE REPORT-TOTAL KNEE ARTHROPLASTY   Pre-operative diagnosis- Osteoarthritis  Left knee(s)  Post-operative diagnosis- Osteoarthritis Left knee(s)  Procedure-  Left  Total Knee Arthroplasty (Depuy Attune)  Surgeon- Dione Plover. Tommi Crepeau, MD  Assistant- Theresa Duty, PA-C   Anesthesia-  Adductor canal block and spinal  EBL-50 mL   Drains Hemovac  Tourniquet time-  Total Tourniquet Time Documented: Thigh (Left) - 40 minutes Total: Thigh (Left) - 40 minutes     Complications- None  Condition-PACU - hemodynamically stable.   Brief Clinical Note   Austin Barker is a 58 y.o. year old male with end stage OA of his left knee with progressively worsening pain and dysfunction. He has constant pain, with activity and at rest and significant functional deficits with difficulties even with ADLs. He has had extensive non-op management including analgesics, injections of cortisone and viscosupplements, and home exercise program, but remains in significant pain with significant dysfunction. Radiographs show bone on bone arthritis medial and patellofemoral. He presents now for left Total Knee Arthroplasty.     Procedure in detail---   The patient is brought into the operating room and positioned supine on the operating table. After successful administration of  Adductor canal block and spinal,   a tourniquet is placed high on the  Left thigh(s) and the lower extremity is prepped and draped in the usual sterile fashion. Time out is performed by the operating team and then the  Left lower extremity is wrapped in Esmarch, knee flexed and the tourniquet inflated to 300 mmHg.       A midline incision is made with a ten blade through the subcutaneous tissue to the level of the extensor mechanism. A fresh blade is used to make a medial parapatellar arthrotomy. Soft tissue over the proximal medial tibia is subperiosteally elevated to the joint line with a knife and into the semimembranosus bursa  with a Cobb elevator. Soft tissue over the proximal lateral tibia is elevated with attention being paid to avoiding the patellar tendon on the tibial tubercle. The patella is everted, knee flexed 90 degrees and the ACL and PCL are removed. Findings are bone on bone medial and patellofemoral with large global ostoephytes        The drill is used to create a starting hole in the distal femur and the canal is thoroughly irrigated with sterile saline to remove the fatty contents. The 5 degree Left  valgus alignment guide is placed into the femoral canal and the distal femoral cutting block is pinned to remove 9 mm off the distal femur. Resection is made with an oscillating saw.      The tibia is subluxed forward and the menisci are removed. The extramedullary alignment guide is placed referencing proximally at the medial aspect of the tibial tubercle and distally along the second metatarsal axis and tibial crest. The block is pinned to remove 73mm off the more deficient medial  side. Resection is made with an oscillating saw. Size 7is the most appropriate size for the tibia and the proximal tibia is prepared with the modular drill and keel punch for that size.      The femoral sizing guide is placed and size 8 is most appropriate. Rotation is marked off the epicondylar axis and confirmed by creating a rectangular flexion gap at 90 degrees. The size 8 cutting block is pinned in this rotation and the anterior, posterior and chamfer cuts are made with the oscillating saw. The intercondylar block is then placed and that  cut is made.      Trial size 7 tibial component, trial size 8 posterior stabilized femur and a 8  mm posterior stabilized rotating platform insert trial is placed. Full extension is achieved with excellent varus/valgus and anterior/posterior balance throughout full range of motion. The patella is everted and thickness measured to be 27  mm. Free hand resection is taken to 15 mm, a 41 template is placed,  lug holes are drilled, trial patella is placed, and it tracks normally. Osteophytes are removed off the posterior femur with the trial in place. All trials are removed and the cut bone surfaces prepared with pulsatile lavage. Cement is mixed and once ready for implantation, the size 7 tibial implant, size  8 posterior stabilized femoral component, and the size 41 patella are cemented in place and the patella is held with the clamp. The trial insert is placed and the knee held in full extension. The Exparel (20 ml mixed with 60 ml saline) is injected into the extensor mechanism, posterior capsule, medial and lateral gutters and subcutaneous tissues.  All extruded cement is removed and once the cement is hard the permanent 8 mm posterior stabilized rotating platform insert is placed into the tibial tray.      The wound is copiously irrigated with saline solution and the extensor mechanism closed over a hemovac drain with #1 V-loc suture. The tourniquet is released for a total tourniquet time of 40  minutes. Flexion against gravity is 140 degrees and the patella tracks normally. Subcutaneous tissue is closed with 2.0 vicryl and subcuticular with running 4.0 Monocryl. The incision is cleaned and dried and steri-strips and a bulky sterile dressing are applied. The limb is placed into a knee immobilizer and the patient is awakened and transported to recovery in stable condition.      Please note that a surgical assistant was a medical necessity for this procedure in order to perform it in a safe and expeditious manner. Surgical assistant was necessary to retract the ligaments and vital neurovascular structures to prevent injury to them and also necessary for proper positioning of the limb to allow for anatomic placement of the prosthesis.   Austin Rankin Maninder Deboer, MD    07/20/2019, 9:23 AM

## 2019-07-21 ENCOUNTER — Encounter (HOSPITAL_COMMUNITY): Payer: Self-pay | Admitting: Orthopedic Surgery

## 2019-07-21 DIAGNOSIS — M1712 Unilateral primary osteoarthritis, left knee: Secondary | ICD-10-CM | POA: Diagnosis not present

## 2019-07-21 LAB — BASIC METABOLIC PANEL
Anion gap: 8 (ref 5–15)
BUN: 16 mg/dL (ref 6–20)
CO2: 24 mmol/L (ref 22–32)
Calcium: 8.7 mg/dL — ABNORMAL LOW (ref 8.9–10.3)
Chloride: 106 mmol/L (ref 98–111)
Creatinine, Ser: 1.36 mg/dL — ABNORMAL HIGH (ref 0.61–1.24)
GFR calc Af Amer: >60 mL/min (ref >60)
GFR calc non Af Amer: 57 mL/min — ABNORMAL LOW (ref >60)
Glucose, Bld: 189 mg/dL — ABNORMAL HIGH (ref 70–99)
Potassium: 3.8 mmol/L (ref 3.5–5.1)
Sodium: 138 mmol/L (ref 135–145)

## 2019-07-21 LAB — CBC
HCT: 38.4 % — ABNORMAL LOW (ref 39.0–52.0)
Hemoglobin: 13.2 g/dL (ref 13.0–17.0)
MCH: 32.1 pg (ref 26.0–34.0)
MCHC: 34.4 g/dL (ref 30.0–36.0)
MCV: 93.4 fL (ref 80.0–100.0)
Platelets: 194 10*3/uL (ref 150–400)
RBC: 4.11 MIL/uL — ABNORMAL LOW (ref 4.22–5.81)
RDW: 13.1 % (ref 11.5–15.5)
WBC: 16 10*3/uL — ABNORMAL HIGH (ref 4.0–10.5)
nRBC: 0 % (ref 0.0–0.2)

## 2019-07-21 MED ORDER — OXYCODONE HCL 5 MG PO TABS: 5.0000 mg | ORAL_TABLET | Freq: Four times a day (QID) | ORAL | 0 refills | Status: DC | PRN

## 2019-07-21 MED ORDER — GABAPENTIN 300 MG PO CAPS: 300.0000 mg | ORAL_CAPSULE | Freq: Three times a day (TID) | ORAL | 0 refills | Status: DC

## 2019-07-21 MED ORDER — METHOCARBAMOL 500 MG PO TABS: 500.0000 mg | ORAL_TABLET | Freq: Four times a day (QID) | ORAL | 0 refills | Status: DC | PRN

## 2019-07-21 NOTE — Progress Notes (Signed)
   Subjective: 1 Day Post-Op Procedure(s) (LRB): TOTAL KNEE ARTHROPLASTY (Left) Patient reports pain as mild.   Patient seen in rounds by Dr. Wynelle Link. Patient is well, and has had no acute complaints or problems other than discomfort in the left knee. No acute events overnight. Patient ambulated 80 feet with PT yesterday. Foley catheter removed, positive flatus. Denies CP, SHOB.  We will continue therapy today.   Objective: Vital signs in last 24 hours: Temp:  [97.4 F (36.3 C)-98.6 F (37 C)] 97.6 F (36.4 C) (11/17 0603) Pulse Rate:  [56-90] 78 (11/17 0603) Resp:  [9-21] 14 (11/17 0603) BP: (107-138)/(64-91) 121/75 (11/17 0603) SpO2:  [95 %-100 %] 98 % (11/17 0603) Weight:  [135 kg] 135 kg (11/16 1204)  Intake/Output from previous day:  Intake/Output Summary (Last 24 hours) at 07/21/2019 0716 Last data filed at 07/21/2019 0603 Gross per 24 hour  Intake 4243.64 ml  Output 3985 ml  Net 258.64 ml     Intake/Output this shift: No intake/output data recorded.  Labs: Recent Labs    07/21/19 0258  HGB 13.2   Recent Labs    07/21/19 0258  WBC 16.0*  RBC 4.11*  HCT 38.4*  PLT 194   Recent Labs    07/21/19 0258  NA 138  K 3.8  CL 106  CO2 24  BUN 16  CREATININE 1.36*  GLUCOSE 189*  CALCIUM 8.7*   No results for input(s): LABPT, INR in the last 72 hours.  Exam: General - Patient is Alert and Oriented Extremity - Neurologically intact Sensation intact distally Intact pulses distally Dorsiflexion/Plantar flexion intact Dressing - dressing C/D/I Motor Function - intact, moving foot and toes well on exam.   Past Medical History:  Diagnosis Date  . A-fib ([place])   . Coronary artery disease   . Dyslipidemia   . Essential hypertension, benign   . Family history of abdominal aortic aneurysm (AAA)   . FH: coronary artery disease   . GERD (gastroesophageal reflux disease)   . Gout, unspecified   . Long term (current) use of anticoagulants   . Morbid  obesity ([place])   . OSA on CPAP     Assessment/Plan: 1 Day Post-Op Procedure(s) (LRB): TOTAL KNEE ARTHROPLASTY (Left) Active Problems:   OA (osteoarthritis) of knee  Estimated body mass index is 39.27 kg/m as calculated from the following:   Height as of this encounter: 6\' 1"  (1.854 m).   Weight as of this encounter: 135 kg. Advance diet Up with therapy D/C IV fluids   Patient's anticipated LOS is less than 2 midnights, meeting these requirements: - Younger than 91 - Lives within 1 hour of care - Has a competent adult at home to recover with post-op recover - NO history of  - Chronic pain requiring opiods  - Diabetes  - Coronary Artery Disease  - Heart failure  - Heart attack  - Stroke  - DVT/VTE  - Cardiac arrhythmia  - Respiratory Failure/COPD  - Renal failure  - Anemia  - Advanced Liver disease  DVT Prophylaxis - Xarelto Weight bearing as tolerated. D/C O2 and pulse ox and try on room air. Hemovac pulled without difficulty, will begin therapy today.  Plan is to go Home after hospital stay. Plan for discharge today following 1-2 sessions of therapy as long as he is meeting goals. Scheduled for OPPT. Follow up in the office in 2 weeks.   Griffith Citron, PA-C Orthopedic Surgery [phone] 07/21/2019, 7:16 AM

## 2019-07-21 NOTE — Progress Notes (Signed)
Therapy Plan: OPPT Has DME  

## 2019-07-21 NOTE — Plan of Care (Signed)
Pt ready for DC home 

## 2019-07-21 NOTE — Progress Notes (Signed)
Physical Therapy Treatment Patient Details Name: Austin Barker MRN: [id number] DOB: 1961/04/23 Today's Date: 07/21/2019    History of Present Illness s/p L TKA PMH: afib on anitcoagulants, HTN    PT Comments    Pt progressing well. Ready for d/c from PT standpoint    Follow Up Recommendations  Follow surgeon's recommendation for DC plan and follow-up therapies     Equipment Recommendations  None recommended by PT    Recommendations for Other Services       Precautions / Restrictions Precautions Precautions: Fall;Knee Knee Immobilizer - Left: Discontinue once straight leg raise with < 10 degree lag Restrictions Weight Bearing Restrictions: No Other Position/Activity Restrictions: WBAT    Mobility  Bed Mobility               General bed mobility comments: NT- pt in recliner  Transfers Overall transfer level: Needs assistance Equipment used: Rolling walker (2 wheeled) Transfers: Sit to/from Stand Sit to Stand: Supervision         General transfer comment: cues for hand placement and LLE position  Ambulation/Gait Ambulation/Gait assistance: Min guard Gait Distance (Feet): 100 Feet Assistive device: Rolling walker (2 wheeled) Gait Pattern/deviations: Step-to pattern;Decreased stance time - left     General Gait Details: cues for RW position, sequence   Stairs             Wheelchair Mobility    Modified Rankin (Stroke Patients Only)       Balance                                            Cognition Arousal/Alertness: Awake/alert Behavior During Therapy: WFL for tasks assessed/performed Overall Cognitive Status: Within Functional Limits for tasks assessed                                        Exercises Total Joint Exercises Ankle Circles/Pumps: AROM;Both;10 reps Quad Sets: AROM;Both;10 reps Short Arc Quad: AROM;10 reps;Left Heel Slides: AAROM;AROM;Left;10 reps Hip ABduction/ADduction:  AROM;Left;10 reps Straight Leg Raises: AROM;Left;10 reps    General Comments        Pertinent Vitals/Pain Pain Score: 3  Pain Location: left knee Pain Descriptors / Indicators: Grimacing;Sore Pain Intervention(s): Monitored during session;Limited activity within patient's tolerance;Ice applied;Repositioned    Home Living                      Prior Function            PT Goals (current goals can now be found in the care plan section) Acute Rehab PT Goals Patient Stated Goal: to get back to work PT Goal Formulation: With patient Time For Goal Achievement: 07/27/19 Potential to Achieve Goals: Good Progress towards PT goals: Progressing toward goals    Frequency    7X/week      PT Plan Current plan remains appropriate    Co-evaluation              AM-PAC PT "6 Clicks" Mobility   Outcome Measure  Help needed turning from your back to your side while in a flat bed without using bedrails?: A Little Help needed moving from lying on your back to sitting on the side of a flat bed without using bedrails?: None Help needed moving to and  from a bed to a chair (including a wheelchair)?: None Help needed standing up from a chair using your arms (e.g., wheelchair or bedside chair)?: A Little Help needed to walk in hospital room?: A Little Help needed climbing 3-5 steps with a railing? : A Little 6 Click Score: 20    End of Session Equipment Utilized During Treatment: Gait belt;Left knee immobilizer Activity Tolerance: Patient tolerated treatment well Patient left: in chair;with call bell/phone within reach;with chair alarm set   PT Visit Diagnosis: Difficulty in walking, not elsewhere classified (R26.2)     Time: 4098-1191 PT Time Calculation (min) (ACUTE ONLY): 20 min  Charges:  $Therapeutic Exercise: 8-22 mins                     Kenyon Ana, PT  Pager: [phone] Acute Rehab Dept [facility]):  [phone]   07/21/2019    [facility] 07/21/2019, 10:44 AM

## 2019-07-29 ENCOUNTER — Other Ambulatory Visit: Payer: Self-pay | Admitting: Cardiology

## 2019-07-29 MED ORDER — RIVAROXABAN 20 MG PO TABS: 20.0000 mg | ORAL_TABLET | Freq: Every day | ORAL | 1 refills | Status: DC

## 2019-07-29 NOTE — Telephone Encounter (Signed)
°*  STAT* If patient is at the pharmacy, call can be transferred to refill team.   1. Which medications need to be refilled? (please list name of each medication and dose if known) rivaroxaban (XARELTO) 20 MG TABS tablet  2. Which pharmacy/location (including street and city if local pharmacy) is medication to be sent to? [facility] [place], [place] AT [address]  3. Do they need a 30 day or 90 day supply? [place]

## 2019-07-29 NOTE — Telephone Encounter (Addendum)
Age 58, weight 135kg, SCr 1.36 on 07/21/19, CrCl > 100. Last OV July 2020, afib indication, refill sent in.

## 2019-11-02 ENCOUNTER — Other Ambulatory Visit: Payer: Self-pay | Admitting: Cardiology

## 2019-11-02 NOTE — Telephone Encounter (Signed)
Prescription refill request for Xarelto received.   Last office visit: 03/20/2019, [place] Weight: 135 kg Age: 59 y.o. Scr: 1.36 07/21/2019 CrCl: 112 ml/min   Prescription refill sent.

## 2019-11-04 IMAGING — CT CT HEART MORPH/PULM VEIN W/ CM & W/O CA SCORE
1 of 10 series · 9 of 20 positions shown, 12 images · non-contrast
Comparison: None.

Addendum:
EXAM:
OVER-READ INTERPRETATION  CT CHEST

The following report is an over-read performed by radiologist Dr.
Elyasa Varadinov [REDACTED] on 09/08/2018. This over-read
does not include interpretation of cardiac or coronary anatomy or
pathology. The coronary CTA interpretation by the cardiologist is
attached.
CLINICAL DATA: 57-year-old male with h/o hypertension, OSA, obesity
and new persistent atrial fibrillation that failed amiodarone
therapy scheduled for an ablation.
Cardiac CT/CTA
TECHNIQUE: The patient was scanned on a Siemens Somatom scanner.

[Series 12: multi 0-90% · axial · 0.40mm/px · z∈[+1092,+1199]mm · 9 of 2240 slices shown, 12 images]
[im 224/2240  vessel]
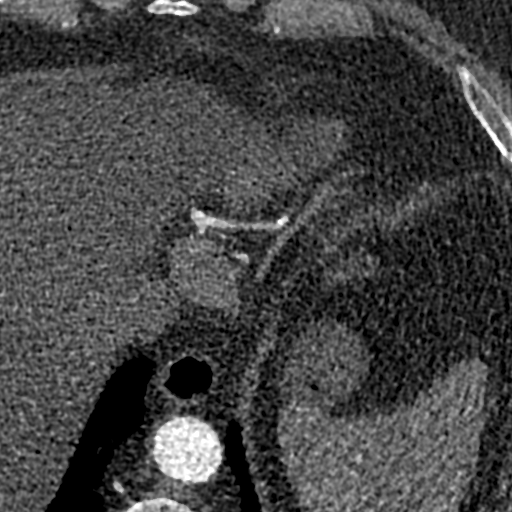
[im 224/2240  lung]
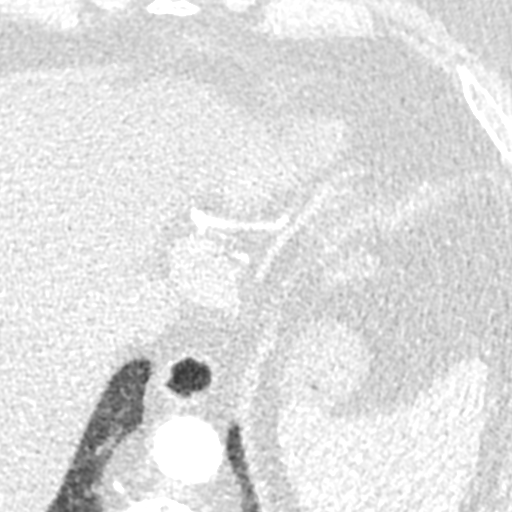
[im 448/2240  vessel]
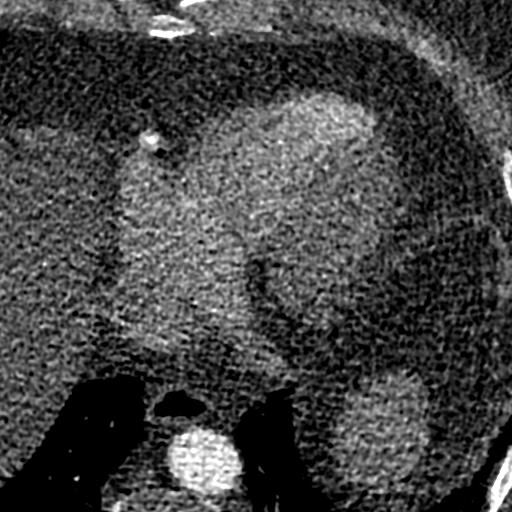
[im 672/2240  vessel]
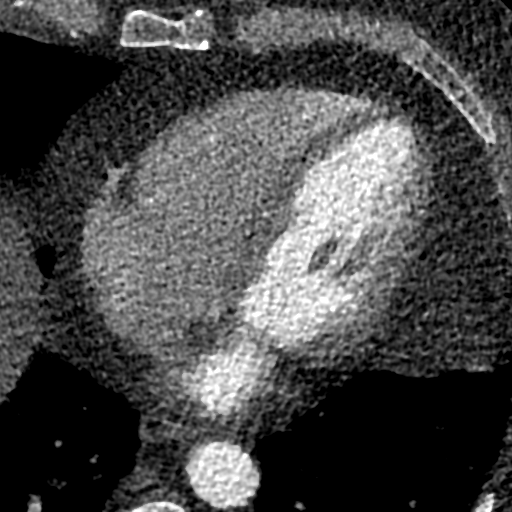
[im 896/2240  vessel]
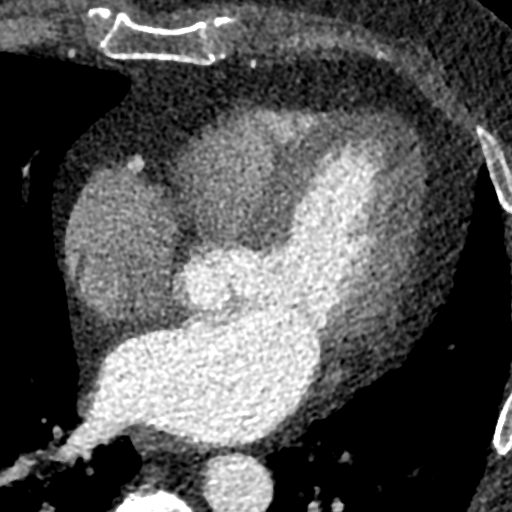
[im 1120/2240  vessel]
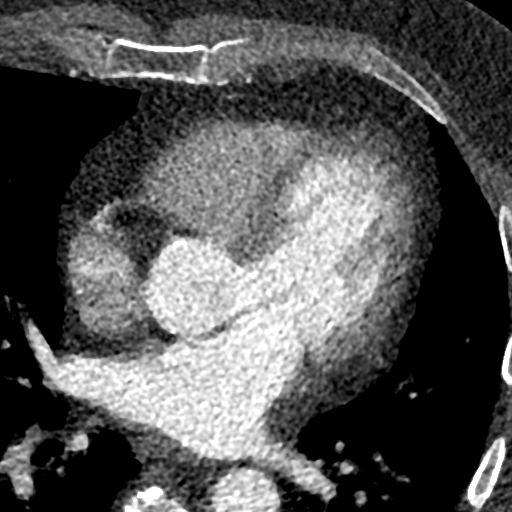
[im 1120/2240  lung]
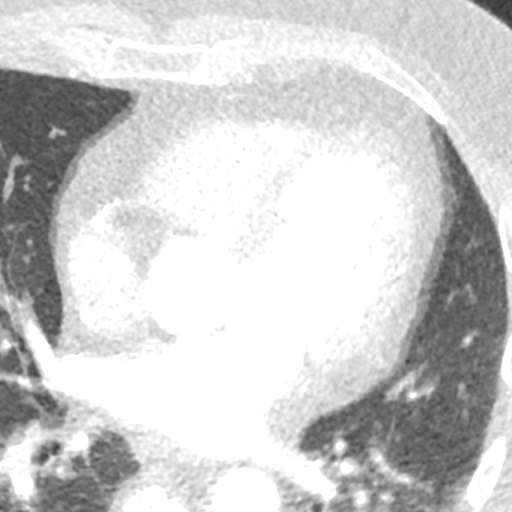
[im 1344/2240  vessel]
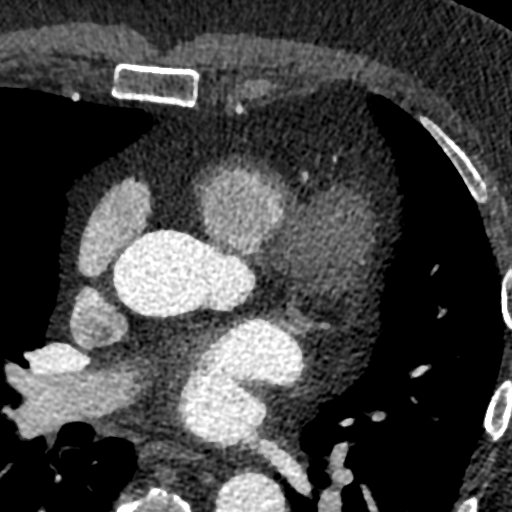
[im 1568/2240  vessel]
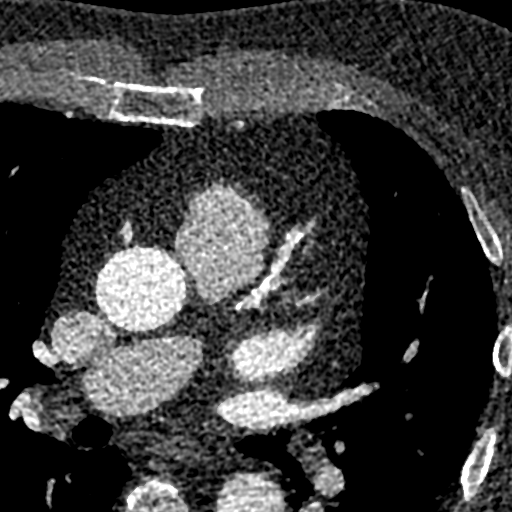
[im 1792/2240  vessel]
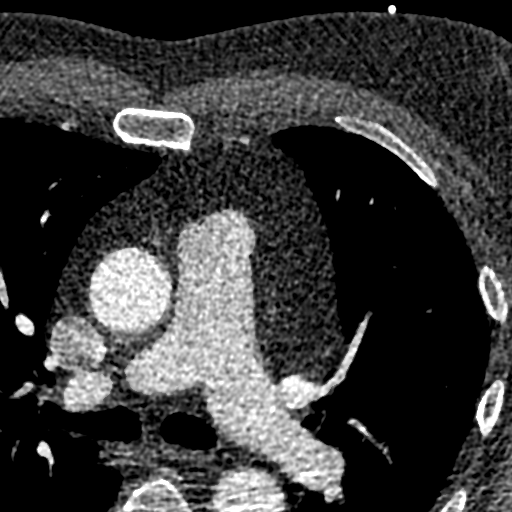
[im 2016/2240  vessel]
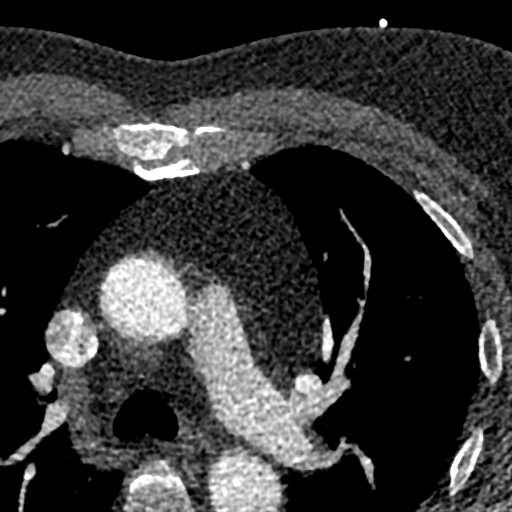
[im 2016/2240  lung]
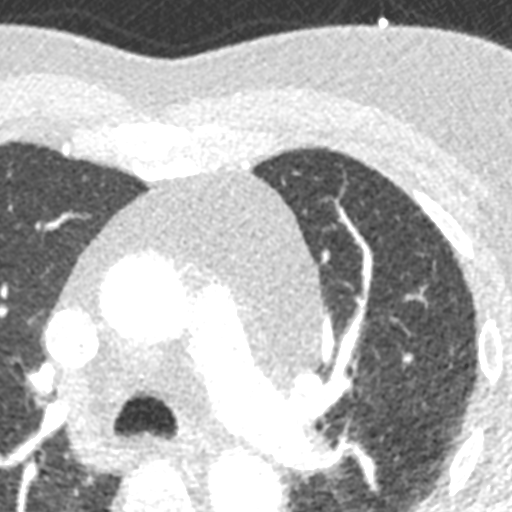

[9 of 20 positions shown; findings below may reference images not displayed]

FINDINGS: Vascular: Heart is normal size.  Visualized aorta is normal caliber.

Mediastinum/Nodes: No adenopathy in the lower mediastinum or hila.

Lungs/Pleura: No confluent opacities or effusions. Minimal dependent
atelectasis.

Upper Abdomen: Low-density noted in the liver, suspect fatty
infiltration. No acute findings.

Musculoskeletal: Chest wall soft tissues are unremarkable. No acute
bony abnormality.
IMPRESSION: No acute extra cardiac abnormality.

Suspect fatty infiltration of the liver.
FINDINGS: A 120 kV prospective scan was triggered in the descending thoracic
aorta at 111 HU's. Gantry rotation speed was 280 msecs and
collimation was .9 mm. No beta blockade and no NTG was given. The 3D
data set was reconstructed in 5% intervals of the 60-80 % of the R-R
cycle. Diastolic phases were analyzed on a dedicated work station
using MPR, MIP and VRT modes. The patient received 80 cc of
contrast.

There is normal pulmonary vein drainage into the left atrium (2 on
the right and 2 on the left) with ostial measurements as follows:

RUPV: 22.7 x 21.3 mm

RLPV: 18.0 x 16.2 mm

LUPV: 20.7 x 17.3 mm

LLPV: 18.9 x 16.3 mm

The left atrial appendage is large with chicken wing morphology, two
major lobes and no evidence for a thrombus.

The esophagus runs in the left atrial midline and is not in the
proximity to any of the pulmonary veins.

Aorta:  Normal caliber.  No dissection, minimal calcifications.

Aortic Valve:  Trileaflet.  No calcifications.

Coronary Arteries: Normal coronary origin. Right dominance. The
study was performed without use of NTG and insufficient for plaque
evaluation. Calcium score was 636 that represents 95 percentile for
age/sex.
IMPRESSION: 1. There is normal pulmonary vein drainage into the left atrium.

2. The left atrial appendage is large with chicken wing morphology,
two major lobes and no evidence for a thrombus.

3. The esophagus runs in the left atrial midline and is not in the
proximity to any of the pulmonary veins.

4. Mildly dilated pulmonary artery measuring 32 mm suggestive of
pulmonary hypertension.

5. Normal coronary origin with right dominance. The study was
performed without use of NTG and insufficient for plaque evaluation.
However high calcium score of 636 that represents 95 percentile for
age/sex. In the presence of symptoms consider D SPECT nuclear stress
test or coronary CTA/CT FFR.

*** End of Addendum ***

## 2020-04-27 ENCOUNTER — Telehealth: Payer: Self-pay | Admitting: *Deleted

## 2020-04-27 NOTE — Telephone Encounter (Signed)
This patient will need to be contacted closer to his surgical date which is 11/29.  Corine Shelter PA-C 04/27/2020 11:39 AM

## 2020-04-27 NOTE — Telephone Encounter (Signed)
   [facility] Pre-operative Risk Assessment    HEARTCARE STAFF: - Please ensure there is not already an duplicate clearance open for this procedure. - Under Visit Info/Reason for Call, type in Other and utilize the format Clearance MM/DD/YY or Clearance TBD. Do not use dashes or single digits. - If request is for dental extraction, please clarify the # of teeth to be extracted.  Request for surgical clearance:  1. What type of surgery is being performed? RIGHT TOTAL KNEE ARTHROPLASTY   2. When is this surgery scheduled? 08/01/20   3. What type of clearance is required (medical clearance vs. Pharmacy clearance to hold med vs. Both)? BOTH  4. Are there any medications that need to be held prior to surgery and how long? [place]   5. Practice name and name of physician performing surgery? [facility];DR. FRANK ALUISIO   6. What is the office phone number? [phone]   7.   What is the office fax number? [phone] ATTN: [place]  8.   Anesthesia type (None, local, MAC, general) ? CHOICE   Julaine Hua 04/27/2020, 11:00 AM  _________________________________________________________________   (provider comments below)

## 2020-05-02 NOTE — Telephone Encounter (Signed)
° °  Primary Cardiologist:No primary care provider on file. -> Patient sees Dr. Elberta Fortis as his primary for electrophysiology.  Chart reviewed as part of pre-operative protocol coverage. Because of [place] past medical history and time since last visit, they will require a follow-up visit in order to better assess preoperative cardiovascular risk. Last OV was over 1 year ago in 03/2019, therefore does not meet criteria to clear via pre-op pool.   The patient is already scheduled with Dr. Elberta Fortis for an appointment in 1 week at which time cardiac clearance can be addressed. Added clearance request to appointment notes. Will also send FYI to Dr. Elberta Fortis and his nurse to make them aware. Per office protocol, the provider should assess clearance at time of office visit and should forward their finalized clearance decision to requesting party below.   Will route to callback to let patient know clearance can be discussed at that visit so he is aware and not left in the dark. I will route to requesting surgeon so they're aware of upcoming appointment and remove this message from the pre-op box.  Laurann Montana, PA-C  05/02/2020, 4:18 PM

## 2020-05-07 ENCOUNTER — Other Ambulatory Visit: Payer: Self-pay | Admitting: Cardiology

## 2020-05-10 ENCOUNTER — Other Ambulatory Visit: Payer: Self-pay

## 2020-05-10 ENCOUNTER — Ambulatory Visit: Payer: BC Managed Care – PPO | Admitting: Cardiology

## 2020-05-10 ENCOUNTER — Encounter: Payer: Self-pay | Admitting: Cardiology

## 2020-05-10 VITALS — BP 126/76 | HR 82 | Ht 73.0 in | Wt 322.0 lb

## 2020-05-10 DIAGNOSIS — I4819 Other persistent atrial fibrillation: Secondary | ICD-10-CM

## 2020-05-10 NOTE — Telephone Encounter (Signed)
Prescription refill request for Xarelto received.   Last office visit: [place], 03/20/2019 Weight: 135 kg  Age: 59 y.o. Scr: 1.36, 07/21/2019 CrCl: 119ml/min   Prescription refill sent. Pt has an appointment to see [facility] 05/10/2020.

## 2020-05-10 NOTE — Telephone Encounter (Signed)
   Primary Cardiologist: No primary care provider on file.  Chart reviewed as part of pre-operative protocol coverage. Given past medical history and time since last visit, based on ACC/AHA guidelines, ALAN DRUMMER would be at acceptable risk for the planned procedure without further cardiovascular testing.    Xarelto was stopped by Dr Elberta Fortis at his office visit 05/10/2020.  No need to resume post op.  The patient was advised that if he develops new symptoms prior to surgery to contact our office to arrange for a follow-up visit, and he verbalized understanding.  I will route this recommendation to the requesting party via Epic fax function and remove from pre-op pool.  Please call with questions.  Corine Shelter, PA-C 05/10/2020, 10:58 AM

## 2020-05-10 NOTE — Patient Instructions (Signed)
Medication Instructions:  Your physician has recommended you make the following change in your medication:  1. STOP Xarelto  *If you need a refill on your cardiac medications before your next appointment, please call your pharmacy*   Lab Work: None ordered   Testing/Procedures: None ordered   Follow-Up: At [facility], you and your health needs are our priority.  As part of our continuing mission to provide you with exceptional heart care, we have created designated Provider Care Teams.  These Care Teams include your primary Cardiologist (physician) and Advanced Practice Providers (APPs -  Physician Assistants and Nurse Practitioners) who all work together to provide you with the care you need, when you need it.  We recommend signing up for the patient portal called "MyChart".  Sign up information is provided on this After Visit Summary.  MyChart is used to connect with patients for Virtual Visits (Telemedicine).  Patients are able to view lab/test results, encounter notes, upcoming appointments, etc.  Non-urgent messages can be sent to your provider as well.   To learn more about what you can do with MyChart, go to [email or web address].    Your next appointment:   1 year(s)  The format for your next appointment:   In Person  Provider:   Loman Brooklyn, MD   Thank you for choosing [facility]!!   Dory Horn, RN [phone]    Other Instructions

## 2020-05-10 NOTE — Progress Notes (Signed)
Electrophysiology Office Note   Date:  05/10/2020   ID:  Austin Barker, DOB 1961/07/16, MRN [id number]  PCP:  Dr Katharina Caper  Cardiologist:  Dr Georgana Curio Primary Electrophysiologist: Dr Elberta Fortis   CC: Evaluation of atrial fibrillation   History of Present Illness: Austin Barker is a 59 y.o. male with a history of OSA, HTN, hyperlipidemia, and obesity who is being seen today for the evaluation of paroxsymal atrial fibrillation referred by Dr. Collier Bullock. . Patient was first diagnosed with atrial fibrillation in September of this year at his PCP office. He was symptomatic with severe fatigue and exertional dyspnea. He was started on amiodarone and Xarelto and underwent DCCV on 06/19/18 which was successful. He continues to have paroxysms of afib and had another DCCV on 06/30/18. He stayed in normal rhythm for about a month and is now back in atrial fibrillation and has been for over a week. Patient has been on Xarelto since his diagnosis.  He is now status post AF ablation 09/12/2018.   Today, denies symptoms of palpitations, chest pain, shortness of breath, orthopnea, PND, lower extremity edema, claudication, dizziness, presyncope, syncope, bleeding, or neurologic sequela. The patient is tolerating medications without difficulties.  He has been doing well since last being seen.  He has had no further episodes of atrial fibrillation.  He continues to try to exercise, though he is having some right ankle has plans for a knee replacement in November.  He is able to do all of his daily activities.  He is able to climb multiple flights of stairs without issue.   Past Medical History:  Diagnosis Date  . A-fib (HCC)       . Dyslipidemia   . Essential hypertension, benign   . Family history of abdominal aortic aneurysm (AAA)   . FH: coronary artery disease   . GERD (gastroesophageal reflux disease)   . Gout, unspecified   . Long term (current) use of anticoagulants   . Morbid obesity (HCC)     . OSA on CPAP    Past Surgical History:  Procedure Laterality Date  . ATRIAL FIBRILLATION ABLATION N/A 09/12/2018   Procedure: ATRIAL FIBRILLATION ABLATION;  Surgeon: Regan Lemming, MD;  Location: [facility] INVASIVE CV LAB;  Service: Cardiovascular;  Laterality: N/A;  . CARDIOVERSION  06/30/2018  . TONSILLECTOMY    . TOTAL KNEE ARTHROPLASTY Left 07/20/2019   Procedure: TOTAL KNEE ARTHROPLASTY;  Surgeon: Ollen Gross, MD;  Location: [facility] ORS;  Service: Orthopedics;  Laterality: Left;      Current Outpatient Medications  Medication Sig Dispense Refill  . acetaminophen (TYLENOL) 500 MG tablet Take 1,000 mg by mouth every 6 (six) hours as needed for mild pain or headache.    Marland Kitchen atenolol (TENORMIN) 50 MG tablet Take 1 tablet (50 mg total) by mouth daily. 90 tablet 2  . atorvastatin (LIPITOR) 40 MG tablet Take 1 tablet (40 mg total) by mouth daily. 90 tablet 3  . benazepril (LOTENSIN) 40 MG tablet Take 40 mg by mouth daily.    . cetirizine (ZYRTEC) 10 MG tablet Take 10 mg by mouth daily.    . Febuxostat (ULORIC) 80 MG TABS Take 80 mg by mouth daily.     . fenofibrate 160 MG tablet Take 160 mg by mouth daily.    Marland Kitchen gabapentin (NEURONTIN) 300 MG capsule Take 1 capsule (300 mg total) by mouth 3 (three) times daily. Take a 300 mg capsule three times a day for two weeks following surgery.Then take  a 300 mg capsule two times a day for two weeks. Then take a 300 mg capsule once a day for two weeks. Then discontinue the Gabapentin. 84 capsule 0  . hydrochlorothiazide (HYDRODIURIL) 25 MG tablet Take 25 mg by mouth daily.    . methocarbamol (ROBAXIN) 500 MG tablet Take 1 tablet (500 mg total) by mouth every 6 (six) hours as needed for muscle spasms. 40 tablet 0  . naphazoline-pheniramine (VISINE-A) 0.025-0.3 % ophthalmic solution Place 1 drop into both eyes daily as needed for eye irritation.    . Omega-3 Fatty Acids (FISH OIL) 1000 MG CAPS Take 1,000 mg by mouth daily.     Marland Kitchen omeprazole (PRILOSEC) 20  MG capsule Take 20 mg by mouth daily.    Carlena Hurl 20 MG TABS tablet TAKE 1 TABLET(20 MG) BY MOUTH DAILY WITH SUPPER 90 tablet 1   No current facility-administered medications for this visit.    Allergies:   Patient has no known allergies.   Social History:  The patient  reports that he has never smoked. He has never used smokeless tobacco. He reports previous alcohol use. He reports that he does not use drugs.   Family History:  The patient's family history includes Diabetes in his father; Heart attack (age of onset: 19) in his brother.    ROS:  Please see the history of present illness.   Otherwise, review of systems is positive for none.   All other systems are reviewed and negative.   PHYSICAL EXAM: VS:  BP 126/76   Pulse 82   Ht 6\' 1"  (1.854 m)   Wt (!) 322 lb (146.1 kg)   SpO2 95%   BMI 42.48 kg/m  , BMI Body mass index is 42.48 kg/m. GEN: Well nourished, well developed, in no acute distress  HEENT: normal  Neck: no JVD, carotid bruits, or masses Cardiac: RRR; no murmurs, rubs, or gallops,no edema  Respiratory:  clear to auscultation bilaterally, normal work of breathing GI: soft, nontender, nondistended, + BS MS: no deformity or atrophy  Skin: warm and dry Neuro:  Strength and sensation are intact Psych: euthymic mood, full affect  EKG:  EKG is ordered today. Personal review of the ekg ordered shows sinus rhythm, rate 82   Recent Labs: 07/16/2019: ALT 53 07/21/2019: BUN 16; Creatinine, Ser 1.36; Hemoglobin 13.2; Platelets 194; Potassium 3.8; Sodium 138    Lipid Panel     Component Value Date/Time   CHOL 159 10/10/2018 0729   TRIG 248 (H) 10/10/2018 0729   HDL 30 (L) 10/10/2018 0729   CHOLHDL 5.3 (H) 10/10/2018 0729   LDLCALC 79 10/10/2018 0729     Wt Readings from Last 3 Encounters:  05/10/20 (!) 322 lb (146.1 kg)  07/20/19 297 lb 9.9 oz (135 kg)  07/16/19 298 lb 8 oz (135.4 kg)      Other studies Reviewed: Additional studies/ records that were  reviewed today include: [facility] notes, DCCV report 06/30/18. Echo 06/16/18 Review of the above records today demonstrates:  Echo-LVEF 55-60%, mild LVH, mild MR, mild aortic root dilation (38mm), LA normal size.   ASSESSMENT AND PLAN:  1.  Persistant atrial fibrillation Currently not anticoagulated with a CHA2DS2-VASc of 1.  Is now status post AF ablation 09/12/2018.  The stroke risk is low.  We Emmaus Brandi plan to stop his Xarelto today.  2. OSA Patient reports good compliance with CPAP.  3. HTN Currently well controlled  4.  Morbid obesity Body mass index is 42.48 kg/m.  Weight loss with diet and exercise encouraged  5.  Preoperative evaluation: Patient has plans for right knee replacement.  He is currently able to do at least 4 METS of activity, climbing 2 flights of stairs.  He is not limited in any of his activity aside from limitations from his knee.  Due to that, he would be at intermediate risk for an intermediate risk procedure.  No medication changes needed at this time.  Current medicines are reviewed at length with the patient today.   The patient does not have concerns regarding his medicines.  The following changes were made today: None  Labs/ tests ordered today include: Orders Placed This Encounter  Procedures  . EKG 12-Lead  . EKG 12-Lead     Disposition:   Follow-up with Sarh Kirschenbaum 12 months  Signed, Roman Sandall Jorja Loa, MD  05/10/2020 10:44 AM     [facility] [address] [place] [address] [phone] (office) [phone] (fax)

## 2020-06-24 ENCOUNTER — Telehealth: Payer: Self-pay | Admitting: Cardiology

## 2020-06-24 MED ORDER — ATENOLOL 50 MG PO TABS
50.0000 mg | ORAL_TABLET | Freq: Every day | ORAL | 3 refills | Status: DC
Start: 2020-06-24 — End: 2021-03-09

## 2020-06-24 NOTE — Telephone Encounter (Signed)
*  STAT* If patient is at the pharmacy, call can be transferred to refill team.   1. Which medications need to be refilled? (please list name of each medication and dose if known) atenolol (TENORMIN) 50 MG tablet [[id number]]    2. Which pharmacy/location (including street and city if local pharmacy) is medication to be sent to  [id number] [place], [place] - [address] AT [facility] OF [facility] [address]  [address] [place], [place] [address]  Phone:  [phone] Fax:  [phone]  3. Do they need a 30 day or 90 day supply? 90 day

## 2020-06-24 NOTE — Telephone Encounter (Signed)
Pt's medication was sent to pt's pharmacy as requested. Confirmation received.  °

## 2020-07-21 NOTE — Progress Notes (Signed)
DUE TO COVID-19 ONLY ONE VISITOR IS ALLOWED TO COME WITH YOU AND STAY IN THE WAITING ROOM ONLY DURING PRE OP AND PROCEDURE DAY OF SURGERY. THE 1 VISITOR  MAY VISIT WITH YOU AFTER SURGERY IN YOUR PRIVATE ROOM DURING VISITING HOURS ONLY!  YOU NEED TO HAVE A COVID 19 TEST ON_11/26/2021 ______ @_______ , THIS TEST MUST BE DONE BEFORE SURGERY,  COVID TESTING SITE [address] [place] , IT IS ON THE RIGHT GOING OUT [address] APPROXIMATELY  2 MINUTES PAST [employer] ON THE RIGHT. ONCE YOUR COVID TEST IS COMPLETED,  PLEASE BEGIN THE QUARANTINE INSTRUCTIONS AS OUTLINED IN YOUR HANDOUT.                Austin Barker  07/21/2020   Your procedure is scheduled on: 08/01/2020    Report to [facility] Main  Entrance   Report to admitting at    0515 AM     Call this number if you have problems the morning of surgery [phone]    REMEMBER: NO  SOLID FOOD CANDY OR GUM AFTER MIDNIGHT. CLEAR LIQUIDS UNTIL   0415am      . NOTHING BY MOUTH EXCEPT CLEAR LIQUIDS UNTIL    . PLEASE FINISH ENSURE DRINK PER SURGEON ORDER  WHICH NEEDS TO BE COMPLETED AT   0415am   .      CLEAR LIQUID DIET   Foods Allowed                                                                    Coffee and tea, regular and decaf                            Fruit ices (not with fruit pulp)                                      Iced Popsicles                                    Carbonated beverages, regular and diet                                    Cranberry, grape and apple juices Sports drinks like Gatorade Lightly seasoned clear broth or consume(fat free) Sugar, honey syrup ___________________________________________________________________      BRUSH YOUR TEETH MORNING OF SURGERY AND RINSE YOUR MOUTH OUT, NO CHEWING GUM CANDY OR MINTS.     Take these medicines the morning of surgery with A SIP OF WATER: atenolol, Zyrtec, Uloric, Prilosec   DO NOT TAKE ANY DIABETIC MEDICATIONS  DAY OF YOUR SURGERY                               You may not have any metal on your body including hair pins and              piercings  Do  not wear jewelry, make-up, lotions, powders or perfumes, deodorant             Do not wear nail polish on your fingernails.  Do not shave  48 hours prior to surgery.              Men may shave face and neck.   Do not bring valuables to the hospital. [place].  Contacts, dentures or bridgework may not be worn into surgery.  Leave suitcase in the car. After surgery it may be brought to your room.     Patients discharged the day of surgery will not be allowed to drive home. IF YOU ARE HAVING SURGERY AND GOING HOME THE SAME DAY, YOU MUST HAVE AN ADULT TO DRIVE YOU HOME AND BE WITH YOU FOR 24 HOURS. YOU MAY GO HOME BY TAXI OR UBER OR ORTHERWISE, BUT AN ADULT MUST ACCOMPANY YOU HOME AND STAY WITH YOU FOR 24 HOURS.  Name and phone number of your driver:  Special Instructions: N/A              Please read over the following fact sheets you were given: _____________________________________________________________________  [facility] - Preparing for Surgery Before surgery, you can play an important role.  Because skin is not sterile, your skin needs to be as free of germs as possible.  You can reduce the number of germs on your skin by washing with CHG (chlorahexidine gluconate) soap before surgery.  CHG is an antiseptic cleaner which kills germs and bonds with the skin to continue killing germs even after washing. Please DO NOT use if you have an allergy to CHG or antibacterial soaps.  If your skin becomes reddened/irritated stop using the CHG and inform your nurse when you arrive at Short Stay. Do not shave (including legs and underarms) for at least 48 hours prior to the first CHG shower.  You may shave your face/neck. Please follow these instructions carefully:  1.  Shower with CHG Soap the night before  surgery and the  morning of Surgery.  2.  If you choose to wash your hair, wash your hair first as usual with your  normal  shampoo.  3.  After you shampoo, rinse your hair and body thoroughly to remove the  shampoo.                           4.  Use CHG as you would any other liquid soap.  You can apply chg directly  to the skin and wash                       Gently with a scrungie or clean washcloth.  5.  Apply the CHG Soap to your body ONLY FROM THE NECK DOWN.   Do not use on face/ open                           Wound or open sores. Avoid contact with eyes, ears mouth and genitals (private parts).                       Wash face,  Genitals (private parts) with your normal soap.             6.  Wash thoroughly,  paying special attention to the area where your surgery  will be performed.  7.  Thoroughly rinse your body with warm water from the neck down.  8.  DO NOT shower/wash with your normal soap after using and rinsing off  the CHG Soap.                9.  Pat yourself dry with a clean towel.            10.  Wear clean pajamas.            11.  Place clean sheets on your bed the night of your first shower and do not  sleep with pets. Day of Surgery : Do not apply any lotions/deodorants the morning of surgery.  Please wear clean clothes to the hospital/surgery center.  FAILURE TO FOLLOW THESE INSTRUCTIONS MAY RESULT IN THE CANCELLATION OF YOUR SURGERY PATIENT SIGNATURE_________________________________  NURSE SIGNATURE__________________________________  ________________________________________________________________________

## 2020-07-25 ENCOUNTER — Encounter (HOSPITAL_COMMUNITY)
Admission: RE | Admit: 2020-07-25 | Discharge: 2020-07-25 | Disposition: A | Discharge status: Home / Self Care | Payer: BC Managed Care – PPO | Source: Ambulatory Visit | Attending: Orthopedic Surgery | Admitting: Orthopedic Surgery

## 2020-08-01 ENCOUNTER — Encounter (HOSPITAL_COMMUNITY): Admission: RE | Payer: Self-pay | Source: Ambulatory Visit

## 2020-08-01 ENCOUNTER — Ambulatory Visit (HOSPITAL_COMMUNITY)
Admission: RE | Admit: 2020-08-01 | Payer: BC Managed Care – PPO | Source: Ambulatory Visit | Admitting: Orthopedic Surgery

## 2020-08-01 SURGERY — ARTHROPLASTY, KNEE, TOTAL
Anesthesia: Choice | Site: Knee | Laterality: Right

## 2020-11-03 ENCOUNTER — Other Ambulatory Visit: Payer: Self-pay | Admitting: Cardiology

## 2020-11-03 NOTE — Telephone Encounter (Addendum)
Xarelto 20mg  refill request received. Pt is 60 years old, weight-146.1kg, Crea-1.36 on 07/21/2019, last seen by Dr. 07/23/2019 on 05/10/2020, Diagnosis-Afib; 05/10/20-Xarelto stopped per Dr. 07/10/20 OV note. Verified with pharmacist Melissa as she did not see where the medication was resumed by Dr. Elberta Fortis.

## 2021-03-07 ENCOUNTER — Other Ambulatory Visit: Payer: Self-pay | Admitting: Cardiology

## 2021-03-09 ENCOUNTER — Telehealth: Payer: Self-pay

## 2021-03-09 MED ORDER — ATENOLOL 50 MG PO TABS
50.0000 mg | ORAL_TABLET | Freq: Every day | ORAL | 0 refills | Status: DC
Start: 2021-03-09 — End: 2021-06-05

## 2021-03-09 NOTE — Telephone Encounter (Signed)
Patient called in requesting a refill on Atenolol rx.   Reviewed chart.   Rx(s) sent to pharmacy electronically.  Patient voiced understanding.

## 2021-05-01 ENCOUNTER — Telehealth: Payer: Self-pay | Admitting: *Deleted

## 2021-05-01 NOTE — Telephone Encounter (Signed)
   [facility] Pre-operative Risk Assessment    Patient Name: Austin Barker  DOB: [birth year] MRN: [id number]  HEARTCARE STAFF:  - IMPORTANT!!!!!! Under Visit Info/Reason for Call, type in Other and utilize the format Clearance MM/DD/YY or Clearance TBD. Do not use dashes or single digits. - Please review there is not already an duplicate clearance open for this procedure. - If request is for dental extraction, please clarify the # of teeth to be extracted. - If the patient is currently at the dentist's office, call Pre-Op Callback Staff (MA/nurse) to input urgent request.  - If the patient is not currently in the dentist office, please route to the Pre-Op pool.  Request for surgical clearance:  What type of surgery is being performed? RIGHT TOTAL KNEE ARTHROPLASTY  When is this surgery scheduled? 07/17/21  What type of clearance is required (medical clearance vs. Pharmacy clearance to hold med vs. Both)? BOTH  Are there any medications that need to be held prior to surgery and how long?  [facility]  Practice name and name of physician performing surgery? [facility]; DR. Gaynelle Arabian  What is the office phone number? [phone]   7.   What is the office fax number? [phone]  8.   Anesthesia type (None, local, MAC, general) ? CHOICE   Julaine Hua 05/01/2021, 3:57 PM  _________________________________________________________________   (provider comments below)

## 2021-05-02 NOTE — Telephone Encounter (Signed)
    Patient Name: Austin Barker  DOB: 15-Mar-1961 MRN: [id number]  Primary Cardiologist: Dr. Elberta Fortis  Chart reviewed as part of pre-operative protocol coverage. Patient was last seen by Dr. Elberta Fortis in 05/2020 at which time patient was doing well from a cardiac standpoint. He mentioned plans for knee surgery at that time and was felt to be at acceptable risk. Patient was contacted today further evaluation and reported doing well since last visit. His knee surgery was postponed due to his BMI and he has worked hard over the last year to get that down. Surgery now scheduled for November 2022. Patient denies any chest pain, shortness of breath, or acute CHF symptoms. Doing well from an atrial fibrillation standpoint since his ablation. He does describe one event about 5 months ago where he thinks he went into atrial fibrillation for about 12 hours but has had no recurrence since then. No palpitations, lightheadedness, dizziness, or syncope. Able to complete >4.0 METS without any problems. Given past medical history and time since last visit, based on ACC/AHA guidelines, [name] would be at acceptable risk for the planned procedure without further cardiovascular testing.   Patient is no longer on Xarelto. This was discontinued at his last visit with Dr. Elberta Fortis in 05/2020.   The patient was advised that if he develops new symptoms prior to surgery to contact our office to arrange for a follow-up visit, and he verbalized understanding.  I will route this recommendation to the requesting party via Epic fax function and remove from pre-op pool.  Please call with questions.  Corrin Parker, PA-C 05/02/2021, 9:39 AM

## 2021-06-03 ENCOUNTER — Other Ambulatory Visit: Payer: Self-pay | Admitting: Cardiology

## 2021-06-26 NOTE — Progress Notes (Signed)
Sent message, via epic in basket, requesting orders in epic from surgeon.  

## 2021-07-07 NOTE — Patient Instructions (Addendum)
DUE TO COVID-19 ONLY ONE VISITOR IS ALLOWED TO COME WITH YOU AND STAY IN THE WAITING ROOM ONLY DURING PRE OP AND PROCEDURE DAY OF SURGERY IF YOU ARE GOING HOME AFTER SURGERY. IF YOU ARE SPENDING THE NIGHT 2 PEOPLE MAY VISIT WITH YOU IN YOUR PRIVATE ROOM AFTER SURGERY UNTIL VISITING  HOURS ARE OVER AT 8:00 PM AND 1 VISITOR CAN SPEND THE NIGHT.   YOU NEED TO HAVE A COVID 19 TEST ON__11/10____ THIS TEST MUST BE DONE BEFORE SURGERY,  COVID TESTING SITE  IS LOCATED AT [address], [place]. REMAIN IN YOUR CAR THIS IS A DRIVE UP TEST. AFTER YOUR COVID TEST PLEASE WEAR A MASK OUT IN PUBLIC AND SOCIAL DISTANCE AND WASH YOUR HANDS FREQUENTLY, ALSO ASK ALL YOUR CLOSE CONTACT PERSONS TO WEAR A MASK AND SOCIAL DISTANCE AND WASH THEIR HANDS FREQUENTLY ALSO.               Austin Barker     Your procedure is scheduled on: 07/17/21   Report to [facility] Main  Entrance   Report to short stay at 5:15 AM     Call this number if you have problems the morning of surgery [phone]    No food after midnight.    You may have clear liquid until 4:30 AM.    At 4:00 AM drink pre surgery drink.   Nothing by mouth after 4:30 AM.   CLEAR LIQUID DIET   Foods Allowed                                                                     Foods Excluded                                                                                        liquids that you cannot  Plain Jell-O any favor except red or purple                                           see through such as: Fruit ices (not with fruit pulp)                                     milk, soups, orange juice  Iced Popsicles                                    All solid food Carbonated beverages, regular and diet                                    Cranberry,  grape and apple juices Sports drinks like Gatorade Lightly seasoned clear broth or consume(fat free) Sugar    BRUSH YOUR TEETH MORNING OF SURGERY AND RINSE YOUR MOUTH OUT, NO  CHEWING GUM CANDY OR MINTS.     Take these medicines the morning of surgery with A SIP OF WATER: Atenolol, Omeprazole  Bring your mask and tubing                               You may not have any metal on your body including              piercings  Do not wear jewelry,  lotions, powders or deodorant             Men may shave face and neck.   Do not bring valuables to the hospital. [facility] IS NOT             RESPONSIBLE   FOR VALUABLES.  Contacts, dentures or bridgework may not be worn into surgery.  Leave suitcase in the car. After surgery it may be brought to your room.                  Please read over the following fact sheets you were given: _____________________________________________________________________             [facility] - Preparing for Surgery Before surgery, you can play an important role.  Because skin is not sterile, your skin needs to be as free of germs as possible.  You can reduce the number of germs on your skin by washing with CHG (chlorahexidine gluconate) soap before surgery.  CHG is an antiseptic cleaner which kills germs and bonds with the skin to continue killing germs even after washing. Please DO NOT use if you have an allergy to CHG or antibacterial soaps.  If your skin becomes reddened/irritated stop using the CHG and inform your nurse when you arrive at Short Stay.  You may shave your face/neck. Please follow these instructions carefully:  1.  Shower with CHG Soap the night before surgery and the  morning of Surgery.  2.  If you choose to wash your hair, wash your hair first as usual with your  normal  shampoo.  3.  After you shampoo, rinse your hair and body thoroughly to remove the  shampoo.                            4.  Use CHG as you would any other liquid soap.  You can apply chg directly  to the skin and wash                       Gently with a scrungie or clean washcloth.  5.  Apply the CHG Soap to your body ONLY FROM THE NECK DOWN.   Do  not use on face/ open                           Wound or open sores. Avoid contact with eyes, ears mouth and genitals (private parts).                       Wash face,  Genitals (private parts) with your normal soap.             6.  Wash thoroughly, paying special attention to the area where your surgery  will be performed.  7.  Thoroughly rinse your body with warm water from the neck down.  8.  DO NOT shower/wash with your normal soap after using and rinsing off  the CHG Soap.                9.  Pat yourself dry with a clean towel.            10.  Wear clean pajamas.            11.  Place clean sheets on your bed the night of your first shower and do not  sleep with pets. Day of Surgery : Do not apply any lotions/deodorants the morning of surgery.  Please wear clean clothes to the hospital/surgery center.  FAILURE TO FOLLOW THESE INSTRUCTIONS MAY RESULT IN THE CANCELLATION OF YOUR SURGERY PATIENT SIGNATURE_________________________________  NURSE SIGNATURE__________________________________  ________________________________________________________________________   Adam Phenix  An incentive spirometer is a tool that can help keep your lungs clear and active. This tool measures how well you are filling your lungs with each breath. Taking long deep breaths may help reverse or decrease the chance of developing breathing (pulmonary) problems (especially infection) following: A long period of time when you are unable to move or be active. BEFORE THE PROCEDURE  If the spirometer includes an indicator to show your best effort, your nurse or respiratory therapist will set it to a desired goal. If possible, sit up straight or lean slightly forward. Try not to slouch. Hold the incentive spirometer in an upright position. INSTRUCTIONS FOR USE  Sit on the edge of your bed if possible, or sit up as far as you can in bed or on a chair. Hold the incentive spirometer in an upright  position. Breathe out normally. Place the mouthpiece in your mouth and seal your lips tightly around it. Breathe in slowly and as deeply as possible, raising the piston or the ball toward the top of the column. Hold your breath for 3-5 seconds or for as long as possible. Allow the piston or ball to fall to the bottom of the column. Remove the mouthpiece from your mouth and breathe out normally. Rest for a few seconds and repeat Steps 1 through 7 at least 10 times every 1-2 hours when you are awake. Take your time and take a few normal breaths between deep breaths. The spirometer may include an indicator to show your best effort. Use the indicator as a goal to work toward during each repetition. After each set of 10 deep breaths, practice coughing to be sure your lungs are clear. If you have an incision (the cut made at the time of surgery), support your incision when coughing by placing a pillow or rolled up towels firmly against it. Once you are able to get out of bed, walk around indoors and cough well. You may stop using the incentive spirometer when instructed by your caregiver.  RISKS AND COMPLICATIONS Take your time so you do not get dizzy or light-headed. If you are in pain, you may need to take or ask for pain medication before doing incentive spirometry. It is harder to take a deep breath if you are having pain. AFTER USE Rest and breathe slowly and easily. It can be helpful to keep track of a log of your progress. Your caregiver can provide you with a simple table to help with this. If you are using the spirometer  at home, follow these instructions: [place] IF:  You are having difficultly using the spirometer. You have trouble using the spirometer as often as instructed. Your pain medication is not giving enough relief while using the spirometer. You develop fever of 100.5 F (38.1 C) or higher. SEEK IMMEDIATE MEDICAL CARE IF:  You cough up bloody sputum that had not been  present before. You develop fever of 102 F (38.9 C) or greater. You develop worsening pain at or near the incision site. MAKE SURE YOU:  Understand these instructions. Will watch your condition. Will get help right away if you are not doing well or get worse. Document Released: 12/31/2006 Document Revised: 11/12/2011 Document Reviewed: 03/03/2007 [facility] Patient Information 2014 [place], [place].   ________________________________________________________________________

## 2021-07-10 ENCOUNTER — Other Ambulatory Visit: Payer: Self-pay

## 2021-07-10 ENCOUNTER — Encounter (HOSPITAL_COMMUNITY)
Admission: RE | Admit: 2021-07-10 | Discharge: 2021-07-10 | Disposition: A | Discharge status: Home / Self Care | Payer: BC Managed Care – PPO | Source: Ambulatory Visit | Attending: Orthopedic Surgery | Admitting: Orthopedic Surgery

## 2021-07-10 ENCOUNTER — Encounter (HOSPITAL_COMMUNITY): Payer: Self-pay

## 2021-07-10 VITALS — BP 146/87 | HR 66 | Temp 98.4°F | Resp 18 | Ht 73.0 in | Wt 292.4 lb

## 2021-07-10 DIAGNOSIS — Z01818 Encounter for other preprocedural examination: Secondary | ICD-10-CM | POA: Diagnosis present

## 2021-07-10 DIAGNOSIS — M1711 Unilateral primary osteoarthritis, right knee: Secondary | ICD-10-CM | POA: Diagnosis not present

## 2021-07-10 LAB — COMPREHENSIVE METABOLIC PANEL
ALT: 44 U/L (ref 0–44)
AST: 40 U/L (ref 15–41)
Albumin: 4.2 g/dL (ref 3.5–5.0)
Alkaline Phosphatase: 57 U/L (ref 38–126)
Anion gap: 9 (ref 5–15)
BUN: 21 mg/dL (ref 8–23)
CO2: 26 mmol/L (ref 22–32)
Calcium: 9.5 mg/dL (ref 8.9–10.3)
Chloride: 105 mmol/L (ref 98–111)
Creatinine, Ser: 1.19 mg/dL (ref 0.61–1.24)
GFR, Estimated: >60 mL/min (ref >60)
Glucose, Bld: 118 mg/dL — ABNORMAL HIGH (ref 70–99)
Potassium: 3.6 mmol/L (ref 3.5–5.1)
Sodium: 140 mmol/L (ref 135–145)
Total Bilirubin: 1.3 mg/dL — ABNORMAL HIGH (ref 0.3–1.2)
Total Protein: 7.1 g/dL (ref 6.5–8.1)

## 2021-07-10 LAB — CBC
HCT: 42.6 % (ref 39.0–52.0)
Hemoglobin: 14.6 g/dL (ref 13.0–17.0)
MCH: 31.5 pg (ref 26.0–34.0)
MCHC: 34.3 g/dL (ref 30.0–36.0)
MCV: 91.8 fL (ref 80.0–100.0)
Platelets: 188 10*3/uL (ref 150–400)
RBC: 4.64 MIL/uL (ref 4.22–5.81)
RDW: 12.9 % (ref 11.5–15.5)
WBC: 6.9 10*3/uL (ref 4.0–10.5)
nRBC: 0 % (ref 0.0–0.2)

## 2021-07-10 LAB — PROTIME-INR
INR: 1.1 (ref 0.8–1.2)
Prothrombin Time: 14.4 seconds (ref 11.4–15.2)

## 2021-07-10 LAB — SURGICAL PCR SCREEN
MRSA, PCR: NEGATIVE
Staphylococcus aureus: NEGATIVE

## 2021-07-10 NOTE — Progress Notes (Signed)
COVID test- 11/10  PCP - Dr. Bertha Stakes Cardiologist - Dr. Carleene Mains LOV 05/01/21  Chest x-ray - no EKG - 07/10/21-chart Stress Test - 2020 ECHO - 2019 Cardiac Cath - no Pacemaker/ICD device last checked:NA  Sleep Study - yes CPAP - yes  Fasting Blood Sugar - NA Checks Blood Sugar _____ times a day  Blood Thinner Instructions:NA Aspirin Instructions: Last Dose:  Anesthesia review: yes  Patient denies shortness of breath, fever, cough and chest pain at PAT appointment Pt has no SOB with any activities.  Patient verbalized understanding of instructions that were given to them at the PAT appointment. Patient was also instructed that they will need to review over the PAT instructions again at home before surgery. Yes

## 2021-07-11 NOTE — Progress Notes (Signed)
Anesthesia Chart Review   Case: [id number] Date/Time: 07/17/21 0700   Procedure: TOTAL KNEE ARTHROPLASTY (Right: Knee)   Anesthesia type: Choice   Pre-op diagnosis: right knee osteoarthritis   Location: [facility] ROOM 09 / [facility]   Surgeons: Gaynelle Arabian, MD       DISCUSSION:60 y.o. never smoker with h/o GERD, HTN, OSA on cpap, a-fib s/p ablation, CAD, right knee OA scheduled for above procedure 07/17/2021 with Dr. Gaynelle Arabian.   Per cardiology preoperative evaluation 05/02/2021, "Chart reviewed as part of pre-operative protocol coverage. Patient was last seen by Dr. Curt Bears in 05/2020 at which time patient was doing well from a cardiac standpoint. He mentioned plans for knee surgery at that time and was felt to be at acceptable risk. Patient was contacted today further evaluation and reported doing well since last visit. His knee surgery was postponed due to his BMI and he has worked hard over the last year to get that down. Surgery now scheduled for November 2022. Patient denies any chest pain, shortness of breath, or acute CHF symptoms. Doing well from an atrial fibrillation standpoint since his ablation. He does describe one event about 5 months ago where he thinks he went into atrial fibrillation for about 12 hours but has had no recurrence since then. No palpitations, lightheadedness, dizziness, or syncope. Able to complete >4.0 METS without any problems. Given past medical history and time since last visit, based on ACC/AHA guidelines, Austin Barker would be at acceptable risk for the planned procedure without further cardiovascular testing.    Patient is no longer on Xarelto. This was discontinued at his last visit with Dr. Curt Bears in 05/2020."  Anticipate pt can proceed with planned procedure barring acute status change.   VS: BP (!) 146/87   Pulse 66   Temp 36.9 C (Oral)   Resp 18   Ht 6\' 1"  (1.854 m)   Wt 132.6 kg   SpO2 99%   BMI 38.57 kg/m   PROVIDERS: Austin Muta, MD is PCP    Austin Lai, MD is EP LABS: Labs reviewed: Acceptable for surgery. (all labs ordered are listed, but only abnormal results are displayed)  Labs Reviewed  COMPREHENSIVE METABOLIC PANEL - Abnormal; Notable for the following components:      Result Value   Glucose, Bld 118 (*)    Total Bilirubin 1.3 (*)    All other components within normal limits  SURGICAL PCR SCREEN  CBC  PROTIME-INR     IMAGES:   EKG: 07/10/2021 Rate 66 bpm  Undetermined rhythm probably normal sinus rhythm, P wave noted in AVF, lead II Otherwise normal ECG  CV: Stress Test 09/30/2018 Nuclear stress EF: 61%. The left ventricular ejection fraction is normal (55-65%). There was no ST segment deviation noted during stress. This is a low risk study. There is no evidence of ischemia and no evidence of previous infarction The study is normal.  Echo 06/16/2018 Left ventricle: The cavity size is normal. Wall thickness was mildly increased in a pattern of concentric left ventricular hypertrophy. Systolic function was normal. The estimated ejection fraction was in the range of 55% to 60%. Left ventricular diastolic function parameters were normal.  Aortic root: the aortic root is mildly dilated Mitral valve: There is mild regurgitation Right ventricle: the cavity size is normal. Systolic function is normal  Pulmonary arteries: Systolic pressure was within the normal range, estimated to be 27 mmHg + right atrial pressure Past Medical History:  Diagnosis Date   A-fib (  HCC)    ablation   Coronary artery disease    Dyslipidemia    Essential hypertension, benign    GERD (gastroesophageal reflux disease)    Gout, unspecified    History of kidney stones 2018   Morbid obesity (HCC)    OSA on CPAP     Past Surgical History:  Procedure Laterality Date   ATRIAL FIBRILLATION ABLATION N/A 09/12/2018   Procedure: ATRIAL FIBRILLATION ABLATION;  Surgeon: Regan Lemming, MD;  [facility];   Service: Cardiovascular;  Laterality: N/A;   CARDIOVERSION  06/30/2018   CYSTOSCOPY WITH HOLMIUM LASER LITHOTRIPSY  2018   x2 with stent   SEPTOPLASTY  1990   TONSILLECTOMY  2015   TOTAL KNEE ARTHROPLASTY Left 07/20/2019   Procedure: TOTAL KNEE ARTHROPLASTY;  Surgeon: Ollen Gross, MD;  Location[facility];  Service: Orthopedics;  Laterality: Left;     MEDICATIONS:  acetaminophen (TYLENOL) 500 MG tablet   atenolol (TENORMIN) 50 MG tablet   atorvastatin (LIPITOR) 40 MG tablet   benazepril (LOTENSIN) 40 MG tablet   cetirizine (ZYRTEC) 10 MG tablet   fenofibrate 160 MG tablet   hydrochlorothiazide (HYDRODIURIL) 25 MG tablet   naphazoline-pheniramine (VISINE-A) 0.025-0.3 % ophthalmic solution   Omega-3 Fatty Acids (FISH OIL) 1000 MG CAPS   omeprazole (PRILOSEC) 20 MG capsule   No current facility-administered medications for this encounter.    Jodell Cipro Ward, PA-C [facility] Pre-Surgical Testing [phone]

## 2021-07-11 NOTE — Anesthesia Preprocedure Evaluation (Addendum)
Anesthesia Evaluation  Patient identified by MRN, date of birth, ID band Patient awake    Reviewed: Allergy & Precautions, NPO status , Patient's Chart, lab work & pertinent test results  Airway Mallampati: II  TM Distance: >3 FB Neck ROM: Full    Dental no notable dental hx.    Pulmonary sleep apnea and Continuous Positive Airway Pressure Ventilation ,    Pulmonary exam normal breath sounds clear to auscultation       Cardiovascular hypertension, Normal cardiovascular exam+ dysrhythmias Atrial Fibrillation  Rhythm:Regular Rate:Normal     Neuro/Psych negative neurological ROS  negative psych ROS   GI/Hepatic negative GI ROS, Neg liver ROS,   Endo/Other  negative endocrine ROS  Renal/GU negative Renal ROS  negative genitourinary   Musculoskeletal  (+) Arthritis , Osteoarthritis,    Abdominal   Peds negative pediatric ROS (+)  Hematology negative hematology ROS (+)   Anesthesia Other Findings   Reproductive/Obstetrics negative OB ROS                            Anesthesia Physical Anesthesia Plan  ASA: 3  Anesthesia Plan: Spinal   Post-op Pain Management:  [facility] for Post-op pain   Induction: Intravenous  PONV Risk Score and Plan: 2 and Propofol infusion, Treatment may vary due to age or medical condition and Ondansetron  Airway Management Planned: Simple Face Mask  Additional Equipment:   Intra-op Plan:   Post-operative Plan:   Informed Consent: I have reviewed the patients History and Physical, chart, labs and discussed the procedure including the risks, benefits and alternatives for the proposed anesthesia with the patient or authorized representative who has indicated his/her understanding and acceptance.     Dental advisory given  Plan Discussed with: CRNA and Surgeon  Anesthesia Plan Comments: (See PAT note 07/10/2021, Jodell Cipro Ward, PA-C)        Anesthesia Quick Evaluation

## 2021-07-13 ENCOUNTER — Other Ambulatory Visit: Payer: Self-pay | Admitting: Orthopedic Surgery

## 2021-07-13 LAB — SARS CORONAVIRUS 2 (TAT 6-24 HRS): SARS Coronavirus 2: NEGATIVE

## 2021-07-16 NOTE — H&P (Signed)
TOTAL KNEE ADMISSION H&P  Patient is being admitted for right total knee arthroplasty.  Subjective:  Chief Complaint: Right knee pain.  HPI: Austin Barker, 60 y.o. male has a history of pain and functional disability in the right knee due to arthritis and has failed non-surgical conservative treatments for greater than 12 weeks to include NSAID's and/or analgesics, flexibility and strengthening excercises, and activity modification. Onset of symptoms was gradual, starting  several  years ago with gradually worsening course since that time. The patient noted no past surgery on the right knee.  Patient currently rates pain in the right knee at 7 out of 10 with activity. Patient has worsening of pain with activity and weight bearing, pain that interferes with activities of daily living, pain with passive range of motion, and crepitus. Patient has evidence of periarticular osteophytes and joint space narrowing by imaging studies. There is no active infection.  Patient Active Problem List   Diagnosis Date Noted   OA (osteoarthritis) of knee 07/20/2019   Gout 11/25/2018   Hyperlipidemia 11/25/2018   Hypertension 11/25/2018   Morbid obesity (HCC) 11/25/2018   Sleep apnea 11/25/2018    Past Medical History:  Diagnosis Date   A-fib [facility])    ablation   Coronary artery disease    Dyslipidemia    Essential hypertension, benign    GERD (gastroesophageal reflux disease)    Gout, unspecified    History of kidney stones 2018   Morbid obesity (HCC)    OSA on CPAP     Past Surgical History:  Procedure Laterality Date   ATRIAL FIBRILLATION ABLATION N/A 09/12/2018   Procedure: ATRIAL FIBRILLATION ABLATION;  Surgeon: Regan Lemming, MD;  [facility];  Service: Cardiovascular;  Laterality: N/A;   CARDIOVERSION  06/30/2018   CYSTOSCOPY WITH HOLMIUM LASER LITHOTRIPSY  2018   x2 with stent   SEPTOPLASTY  1990   TONSILLECTOMY  2015   TOTAL KNEE ARTHROPLASTY Left 07/20/2019    Procedure: TOTAL KNEE ARTHROPLASTY;  Surgeon: Ollen Gross, MD;  Location[facility];  Service: Orthopedics;  Laterality: Left;     Prior to Admission medications   Medication Sig Start Date End Date Taking? Authorizing Provider  acetaminophen (TYLENOL) 500 MG tablet Take 1,000 mg by mouth every 6 (six) hours as needed for mild pain or headache.   Yes [provider]  atenolol (TENORMIN) 50 MG tablet Take 1 tablet (50 mg total) by mouth daily. Please make overdue appt with Dr. Elberta Fortis before anymore refills. Thank you 1st attempt 06/05/21  Yes Camnitz, Will Daphine Deutscher, MD  atorvastatin (LIPITOR) 40 MG tablet Take 1 tablet (40 mg total) by mouth daily. 12/16/18  Yes Camnitz, Will Daphine Deutscher, MD  benazepril (LOTENSIN) 40 MG tablet Take 40 mg by mouth daily.   Yes [provider]  cetirizine (ZYRTEC) 10 MG tablet Take 10 mg by mouth daily.   Yes [provider]  fenofibrate 160 MG tablet Take 160 mg by mouth daily.   Yes [provider]  hydrochlorothiazide (HYDRODIURIL) 25 MG tablet Take 25 mg by mouth daily.   Yes [provider]  naphazoline-pheniramine (VISINE-A) 0.025-0.3 % ophthalmic solution Place 1 drop into both eyes daily as needed for eye irritation.   Yes [provider]  Omega-3 Fatty Acids (FISH OIL) 1000 MG CAPS Take 1,000 mg by mouth daily.    Yes [provider]  omeprazole (PRILOSEC) 20 MG capsule Take 20 mg by mouth daily.   Yes [provider]    No Known Allergies  Social History   Socioeconomic History   Marital status: Married    Spouse name: Not on file   Number of children: Not on file   Years of education: Not on file   Highest education level: Not on file  Occupational History   Not on file  Tobacco Use   Smoking status: Never   Smokeless tobacco: Never  Vaping Use   Vaping Use: Never used  Substance and Sexual Activity   Alcohol use: Not Currently   Drug use: Never   Sexual activity:  Not on file    Comment: married....  Other Topics Concern   Not on file  Social History Narrative   Not on file   Social Determinants of Health   Financial Resource Strain: Not on file  Food Insecurity: Not on file  Transportation Needs: Not on file  Physical Activity: Not on file  Stress: Not on file  Social Connections: Not on file  Intimate Partner Violence: Not on file    Tobacco Use: Low Risk    Smoking Tobacco Use: Never   Smokeless Tobacco Use: Never   Passive Exposure: Not on file   Social History   Substance and Sexual Activity  Alcohol Use Not Currently    Family History  Problem Relation Age of Onset   Diabetes Father    Heart attack Brother 72    ROS: Constitutional: no fever, no chills, no night sweats, no significant weight loss Cardiovascular: no chest pain, no palpitations Respiratory: no cough, no shortness of breath, No COPD Gastrointestinal: no vomiting, no nausea Musculoskeletal: no swelling in Joints, Joint Pain Neurologic: no numbness, no tingling, no difficulty with balance   Objective:  Physical Exam: Well nourished and well developed.  General: Alert and oriented x3, cooperative and pleasant, no acute distress.  Head: normocephalic, atraumatic, neck supple.  Eyes: EOMI.  Respiratory: breath sounds clear in all fields, no wheezing, rales, or rhonchi. Cardiovascular: Regular rate and rhythm, no murmurs, gallops or rubs.  Abdomen: non-tender to palpation and soft, normoactive bowel sounds. Musculoskeletal:  Left Knee Exam:  Well-healed scar.  There is no swelling.  The range of motion: 0 to 122 degrees.  No tenderness.  The knee is stable.    Right Knee Exam:  Varus deformity.  No effusion present. No swelling present.  The range of motion is: 5 to 120 degrees.  Moderate crepitus on range of motion of the knee.  Medial greater than lateral joint line tenderness.  The knee is stable.     Calves soft and nontender. Motor  function intact in LE. Strength 5/5 LE bilaterally. Neuro: Distal pulses 2+. Sensation to light touch intact in LE.  Vital signs in last 24 hours:    Imaging Review Radiographs- AP and lateral of the bilateral knees dated 011/03/2021 demonstrate the prosthesis on the left in excellent position with no periprosthetic abnormalities. On the right, he has bone-on-bone arthritis in the medial and patellofemoral compartments with slight varus.   Assessment/Plan:  End stage arthritis, right knee   The patient history, physical examination, clinical judgment of the provider and imaging studies are consistent with end stage degenerative joint disease of the right knee and total knee arthroplasty is deemed medically necessary. The treatment options including medical management, injection therapy arthroscopy and arthroplasty were discussed at length. The risks and benefits of total knee arthroplasty were presented and reviewed. The risks due to aseptic loosening, infection, stiffness, patella tracking problems,  thromboembolic complications and other imponderables were discussed. The patient acknowledged the explanation, agreed to proceed with the plan and consent was signed. Patient is being admitted for inpatient treatment for surgery, pain control, PT, OT, prophylactic antibiotics, VTE prophylaxis, progressive ambulation and ADLs and discharge planning. The patient is planning to be discharged  home .   Patient's anticipated LOS is less than 2 midnights, meeting these requirements: - Younger than 47 - Lives within 1 hour of care - Has a competent adult at home to recover with post-op recover - NO history of  - Chronic pain requiring opioids  - Diabetes  - Coronary Artery Disease  - Heart failure  - Heart attack  - Stroke  - DVT/VTE  - Cardiac arrhythmia  - Respiratory Failure/COPD  - Renal failure  - Anemia  - Advanced Liver disease  Therapy Plans: [facility] Disposition: Home with  Brother/Sister-in-Law Planned DVT Prophylaxis: Xarelto 10mg  (Hx of A-Fib) DME Needed: None PCP: Glean Salen, MD (clearance received) Cardiologist: Reggy Eye, MD (clearance received) TXA: IV Allergies: None Anesthesia Concerns: None BMI: 38.4 Last HgbA1c: n/a  Pharmacy: [facility] on [address]      - Patient was instructed on what medications to stop prior to surgery. - Follow-up visit in 2 weeks with Dr. Wynelle Link - Begin physical therapy following surgery - Pre-operative lab work as pre-surgical testing - Prescriptions will be provided in hospital at time of discharge  Fenton Foy, [facility], PA-C Orthopedic Surgery [facility] [place]

## 2021-07-17 ENCOUNTER — Other Ambulatory Visit: Payer: Self-pay

## 2021-07-17 ENCOUNTER — Encounter (HOSPITAL_COMMUNITY): Payer: Self-pay | Admitting: Orthopedic Surgery

## 2021-07-17 ENCOUNTER — Ambulatory Visit (HOSPITAL_COMMUNITY): Payer: BC Managed Care – PPO | Admitting: Physician Assistant

## 2021-07-17 ENCOUNTER — Observation Stay (HOSPITAL_COMMUNITY)
Admission: RE | Admit: 2021-07-17 | Discharge: 2021-07-18 | Disposition: A | Discharge status: Home / Self Care | Payer: BC Managed Care – PPO | Source: Ambulatory Visit | Attending: Orthopedic Surgery | Admitting: Orthopedic Surgery

## 2021-07-17 ENCOUNTER — Encounter (HOSPITAL_COMMUNITY)
Admission: RE | Disposition: A | Discharge status: Home / Self Care | Payer: Self-pay | Source: Ambulatory Visit | Attending: Orthopedic Surgery

## 2021-07-17 ENCOUNTER — Ambulatory Visit (HOSPITAL_COMMUNITY): Payer: BC Managed Care – PPO | Admitting: Certified Registered"

## 2021-07-17 DIAGNOSIS — M1711 Unilateral primary osteoarthritis, right knee: Secondary | ICD-10-CM | POA: Diagnosis not present

## 2021-07-17 DIAGNOSIS — Z6838 Body mass index (BMI) 38.0-38.9, adult: Secondary | ICD-10-CM | POA: Insufficient documentation

## 2021-07-17 DIAGNOSIS — Z79899 Other long term (current) drug therapy: Secondary | ICD-10-CM | POA: Insufficient documentation

## 2021-07-17 DIAGNOSIS — I2510 Atherosclerotic heart disease of native coronary artery without angina pectoris: Secondary | ICD-10-CM | POA: Insufficient documentation

## 2021-07-17 DIAGNOSIS — Z96652 Presence of left artificial knee joint: Secondary | ICD-10-CM | POA: Diagnosis not present

## 2021-07-17 DIAGNOSIS — Z8679 Personal history of other diseases of the circulatory system: Secondary | ICD-10-CM | POA: Diagnosis not present

## 2021-07-17 DIAGNOSIS — G4733 Obstructive sleep apnea (adult) (pediatric): Secondary | ICD-10-CM | POA: Diagnosis not present

## 2021-07-17 DIAGNOSIS — I10 Essential (primary) hypertension: Secondary | ICD-10-CM | POA: Insufficient documentation

## 2021-07-17 HISTORY — PX: TOTAL KNEE ARTHROPLASTY: SHX125

## 2021-07-17 SURGERY — ARTHROPLASTY, KNEE, TOTAL
Anesthesia: Spinal | Site: Knee | Laterality: Right

## 2021-07-17 MED ORDER — FENTANYL CITRATE (PF) 100 MCG/2ML IJ SOLN
INTRAMUSCULAR | Status: DC | PRN
  Administered 2021-07-17: 50 ug via INTRAVENOUS

## 2021-07-17 MED ORDER — BUPIVACAINE LIPOSOME 1.3 % IJ SUSP
INTRAMUSCULAR | Status: AC
  Filled 2021-07-17: qty 20

## 2021-07-17 MED ORDER — HYDROMORPHONE HCL 1 MG/ML IJ SOLN: 0.2500 mg | INTRAMUSCULAR | Status: DC | PRN

## 2021-07-17 MED ORDER — PROPOFOL 10 MG/ML IV BOLUS
INTRAVENOUS | Status: DC | PRN
  Administered 2021-07-17 (×2): 20 mg via INTRAVENOUS

## 2021-07-17 MED ORDER — NAPHAZOLINE-PHENIRAMINE 0.025-0.3 % OP SOLN
1.0000 [drp] | Freq: Every day | OPHTHALMIC | Status: DC | PRN
  Filled 2021-07-17: qty 15

## 2021-07-17 MED ORDER — CHLORHEXIDINE GLUCONATE 0.12 % MT SOLN
15.0000 mL | Freq: Once | OROMUCOSAL | Status: AC
  Administered 2021-07-17: 15 mL via OROMUCOSAL

## 2021-07-17 MED ORDER — TRAMADOL HCL 50 MG PO TABS
50.0000 mg | ORAL_TABLET | Freq: Four times a day (QID) | ORAL | Status: DC | PRN
  Administered 2021-07-17 – 2021-07-18 (×3): 100 mg via ORAL
  Filled 2021-07-17 (×3): qty 2

## 2021-07-17 MED ORDER — DIPHENHYDRAMINE HCL 12.5 MG/5ML PO ELIX: 12.5000 mg | ORAL_SOLUTION | ORAL | Status: DC | PRN

## 2021-07-17 MED ORDER — MIDAZOLAM HCL 2 MG/2ML IJ SOLN
INTRAMUSCULAR | Status: DC | PRN
  Administered 2021-07-17: 2 mg via INTRAVENOUS

## 2021-07-17 MED ORDER — POVIDONE-IODINE 10 % EX SWAB
2.0000 "application " | Freq: Once | CUTANEOUS | Status: AC
  Administered 2021-07-17: 2 via TOPICAL

## 2021-07-17 MED ORDER — ONDANSETRON HCL 4 MG/2ML IJ SOLN: 4.0000 mg | Freq: Four times a day (QID) | INTRAMUSCULAR | Status: DC | PRN

## 2021-07-17 MED ORDER — LACTATED RINGERS IV SOLN: INTRAVENOUS | Status: DC

## 2021-07-17 MED ORDER — METOCLOPRAMIDE HCL 5 MG/ML IJ SOLN: 5.0000 mg | Freq: Three times a day (TID) | INTRAMUSCULAR | Status: DC | PRN

## 2021-07-17 MED ORDER — MORPHINE SULFATE (PF) 4 MG/ML IV SOLN: 0.5000 mg | INTRAVENOUS | Status: DC | PRN

## 2021-07-17 MED ORDER — ATORVASTATIN CALCIUM 40 MG PO TABS
40.0000 mg | ORAL_TABLET | Freq: Every day | ORAL | Status: DC
  Administered 2021-07-18: 40 mg via ORAL
  Filled 2021-07-17: qty 1

## 2021-07-17 MED ORDER — CEFAZOLIN SODIUM-DEXTROSE 2-3 GM-%(50ML) IV SOLR
INTRAVENOUS | Status: DC | PRN
  Administered 2021-07-17: 1 g via INTRAVENOUS
  Administered 2021-07-17: 2 g via INTRAVENOUS

## 2021-07-17 MED ORDER — ONDANSETRON HCL 4 MG/2ML IJ SOLN
INTRAMUSCULAR | Status: DC | PRN
  Administered 2021-07-17: 4 mg via INTRAVENOUS

## 2021-07-17 MED ORDER — METOCLOPRAMIDE HCL 5 MG PO TABS: 5.0000 mg | ORAL_TABLET | Freq: Three times a day (TID) | ORAL | Status: DC | PRN

## 2021-07-17 MED ORDER — METHOCARBAMOL 500 MG IVPB - SIMPLE MED
500.0000 mg | Freq: Four times a day (QID) | INTRAVENOUS | Status: DC | PRN
  Filled 2021-07-17: qty 50

## 2021-07-17 MED ORDER — MENTHOL 3 MG MT LOZG: 1.0000 | LOZENGE | OROMUCOSAL | Status: DC | PRN

## 2021-07-17 MED ORDER — PANTOPRAZOLE SODIUM 40 MG PO TBEC
40.0000 mg | DELAYED_RELEASE_TABLET | Freq: Every day | ORAL | Status: DC
  Administered 2021-07-18: 40 mg via ORAL
  Filled 2021-07-17: qty 1

## 2021-07-17 MED ORDER — METHOCARBAMOL 500 MG PO TABS
500.0000 mg | ORAL_TABLET | Freq: Four times a day (QID) | ORAL | Status: DC | PRN
  Administered 2021-07-17 – 2021-07-18 (×3): 500 mg via ORAL
  Filled 2021-07-17 (×3): qty 1

## 2021-07-17 MED ORDER — DOCUSATE SODIUM 100 MG PO CAPS
100.0000 mg | ORAL_CAPSULE | Freq: Two times a day (BID) | ORAL | Status: DC
  Administered 2021-07-17 – 2021-07-18 (×3): 100 mg via ORAL
  Filled 2021-07-17 (×3): qty 1

## 2021-07-17 MED ORDER — BISACODYL 10 MG RE SUPP: 10.0000 mg | Freq: Every day | RECTAL | Status: DC | PRN

## 2021-07-17 MED ORDER — MIDAZOLAM HCL 2 MG/2ML IJ SOLN
INTRAMUSCULAR | Status: AC
  Filled 2021-07-17: qty 2

## 2021-07-17 MED ORDER — DEXAMETHASONE SODIUM PHOSPHATE 10 MG/ML IJ SOLN
10.0000 mg | Freq: Once | INTRAMUSCULAR | Status: AC
  Administered 2021-07-18: 10 mg via INTRAVENOUS
  Filled 2021-07-17: qty 1

## 2021-07-17 MED ORDER — CEFAZOLIN IN SODIUM CHLORIDE 3-0.9 GM/100ML-% IV SOLN
3.0000 g | INTRAVENOUS | Status: DC
  Filled 2021-07-17: qty 100

## 2021-07-17 MED ORDER — ATENOLOL 50 MG PO TABS
50.0000 mg | ORAL_TABLET | Freq: Every day | ORAL | Status: DC
  Administered 2021-07-18: 50 mg via ORAL
  Filled 2021-07-17: qty 1

## 2021-07-17 MED ORDER — PROPOFOL 500 MG/50ML IV EMUL
INTRAVENOUS | Status: DC | PRN
  Administered 2021-07-17: 125 ug/kg/min via INTRAVENOUS

## 2021-07-17 MED ORDER — ONDANSETRON HCL 4 MG/2ML IJ SOLN
INTRAMUSCULAR | Status: AC
  Filled 2021-07-17: qty 2

## 2021-07-17 MED ORDER — HYDROCHLOROTHIAZIDE 25 MG PO TABS
25.0000 mg | ORAL_TABLET | Freq: Every day | ORAL | Status: DC
  Administered 2021-07-18: 25 mg via ORAL
  Filled 2021-07-17: qty 1

## 2021-07-17 MED ORDER — OXYCODONE HCL 5 MG PO TABS
5.0000 mg | ORAL_TABLET | ORAL | Status: DC | PRN
  Administered 2021-07-17 – 2021-07-18 (×4): 10 mg via ORAL
  Filled 2021-07-17 (×3): qty 2

## 2021-07-17 MED ORDER — DEXAMETHASONE SODIUM PHOSPHATE 10 MG/ML IJ SOLN
INTRAMUSCULAR | Status: AC
  Filled 2021-07-17: qty 1

## 2021-07-17 MED ORDER — FENOFIBRATE 160 MG PO TABS
160.0000 mg | ORAL_TABLET | Freq: Every day | ORAL | Status: DC
  Administered 2021-07-18: 160 mg via ORAL
  Filled 2021-07-17: qty 1

## 2021-07-17 MED ORDER — FENTANYL CITRATE (PF) 100 MCG/2ML IJ SOLN
INTRAMUSCULAR | Status: AC
  Filled 2021-07-17: qty 2

## 2021-07-17 MED ORDER — PROPOFOL 10 MG/ML IV BOLUS
INTRAVENOUS | Status: AC
  Filled 2021-07-17: qty 20

## 2021-07-17 MED ORDER — 0.9 % SODIUM CHLORIDE (POUR BTL) OPTIME
TOPICAL | Status: DC | PRN
  Administered 2021-07-17: 1000 mL

## 2021-07-17 MED ORDER — PROPOFOL 500 MG/50ML IV EMUL
INTRAVENOUS | Status: AC
  Filled 2021-07-17: qty 50

## 2021-07-17 MED ORDER — SODIUM CHLORIDE (PF) 0.9 % IJ SOLN
INTRAMUSCULAR | Status: AC
  Filled 2021-07-17: qty 10

## 2021-07-17 MED ORDER — ONDANSETRON HCL 4 MG PO TABS: 4.0000 mg | ORAL_TABLET | Freq: Four times a day (QID) | ORAL | Status: DC | PRN

## 2021-07-17 MED ORDER — PROPOFOL 1000 MG/100ML IV EMUL
INTRAVENOUS | Status: AC
  Filled 2021-07-17: qty 100

## 2021-07-17 MED ORDER — SODIUM CHLORIDE (PF) 0.9 % IJ SOLN
INTRAMUSCULAR | Status: DC | PRN
  Administered 2021-07-17: 60 mL

## 2021-07-17 MED ORDER — TRANEXAMIC ACID-NACL 1000-0.7 MG/100ML-% IV SOLN
1000.0000 mg | INTRAVENOUS | Status: AC
  Administered 2021-07-17: 1000 mg via INTRAVENOUS
  Filled 2021-07-17: qty 100

## 2021-07-17 MED ORDER — BUPIVACAINE LIPOSOME 1.3 % IJ SUSP
INTRAMUSCULAR | Status: DC | PRN
  Administered 2021-07-17: 20 mL

## 2021-07-17 MED ORDER — SODIUM CHLORIDE 0.9 % IR SOLN
Status: DC | PRN
  Administered 2021-07-17: 1000 mL

## 2021-07-17 MED ORDER — RIVAROXABAN 10 MG PO TABS
10.0000 mg | ORAL_TABLET | Freq: Every day | ORAL | Status: DC
  Administered 2021-07-18: 10 mg via ORAL
  Filled 2021-07-17: qty 1

## 2021-07-17 MED ORDER — CEFAZOLIN SODIUM-DEXTROSE 2-4 GM/100ML-% IV SOLN
INTRAVENOUS | Status: AC
  Filled 2021-07-17: qty 100

## 2021-07-17 MED ORDER — FLEET ENEMA 7-19 GM/118ML RE ENEM: 1.0000 | ENEMA | Freq: Once | RECTAL | Status: DC | PRN

## 2021-07-17 MED ORDER — CEFAZOLIN SODIUM-DEXTROSE 2-4 GM/100ML-% IV SOLN
2.0000 g | Freq: Four times a day (QID) | INTRAVENOUS | Status: AC
  Administered 2021-07-17 (×2): 2 g via INTRAVENOUS
  Filled 2021-07-17 (×2): qty 100

## 2021-07-17 MED ORDER — ORAL CARE MOUTH RINSE: 15.0000 mL | Freq: Once | OROMUCOSAL | Status: AC

## 2021-07-17 MED ORDER — STERILE WATER FOR IRRIGATION IR SOLN
Status: DC | PRN
  Administered 2021-07-17: 2000 mL

## 2021-07-17 MED ORDER — OXYCODONE HCL 5 MG PO TABS
10.0000 mg | ORAL_TABLET | ORAL | Status: DC | PRN
  Administered 2021-07-17: 15 mg via ORAL
  Filled 2021-07-17 (×2): qty 3

## 2021-07-17 MED ORDER — POLYETHYLENE GLYCOL 3350 17 G PO PACK: 17.0000 g | PACK | Freq: Every day | ORAL | Status: DC | PRN

## 2021-07-17 MED ORDER — ONDANSETRON HCL 4 MG/2ML IJ SOLN: 4.0000 mg | Freq: Once | INTRAMUSCULAR | Status: DC | PRN

## 2021-07-17 MED ORDER — BUPIVACAINE LIPOSOME 1.3 % IJ SUSP: 20.0000 mL | Freq: Once | INTRAMUSCULAR | Status: DC

## 2021-07-17 MED ORDER — DEXAMETHASONE SODIUM PHOSPHATE 10 MG/ML IJ SOLN
8.0000 mg | Freq: Once | INTRAMUSCULAR | Status: AC
  Administered 2021-07-17: 8 mg via INTRAVENOUS

## 2021-07-17 MED ORDER — PHENOL 1.4 % MT LIQD: 1.0000 | OROMUCOSAL | Status: DC | PRN

## 2021-07-17 MED ORDER — ROPIVACAINE HCL 5 MG/ML IJ SOLN
INTRAMUSCULAR | Status: DC | PRN
  Administered 2021-07-17: 30 mL via PERINEURAL

## 2021-07-17 MED ORDER — SODIUM CHLORIDE 0.9 % IV SOLN: INTRAVENOUS | Status: DC

## 2021-07-17 MED ORDER — ACETAMINOPHEN 10 MG/ML IV SOLN
1000.0000 mg | Freq: Four times a day (QID) | INTRAVENOUS | Status: DC
  Administered 2021-07-17: 1000 mg via INTRAVENOUS
  Filled 2021-07-17: qty 100

## 2021-07-17 MED ORDER — GABAPENTIN 300 MG PO CAPS
300.0000 mg | ORAL_CAPSULE | Freq: Three times a day (TID) | ORAL | Status: DC
  Administered 2021-07-17 – 2021-07-18 (×4): 300 mg via ORAL
  Filled 2021-07-17 (×4): qty 1

## 2021-07-17 MED ORDER — ACETAMINOPHEN 500 MG PO TABS
1000.0000 mg | ORAL_TABLET | Freq: Four times a day (QID) | ORAL | Status: AC
  Administered 2021-07-17 – 2021-07-18 (×4): 1000 mg via ORAL
  Filled 2021-07-17 (×4): qty 2

## 2021-07-17 MED ORDER — ACETAMINOPHEN 10 MG/ML IV SOLN: 1000.0000 mg | Freq: Once | INTRAVENOUS | Status: DC | PRN

## 2021-07-17 SURGICAL SUPPLY — 51 items
ATTUNE MED DOME PAT 41 KNEE (Knees) ×2 IMPLANT
ATTUNE PS FEM RT SZ 8 CEM KNEE (Femur) ×2 IMPLANT
ATTUNE PSRP INSR SZ8 12 KNEE (Insert) ×2 IMPLANT
BAG COUNTER SPONGE SURGICOUNT (BAG) ×2 IMPLANT
BAG ZIPLOCK 12X15 (MISCELLANEOUS) ×2 IMPLANT
BASE TIBIAL ROT PLAT SZ 7 KNEE (Knees) ×1 IMPLANT
BLADE SAG 18X100X1.27 (BLADE) ×2 IMPLANT
BLADE SAW SGTL 11.0X1.19X90.0M (BLADE) ×2 IMPLANT
BNDG ELASTIC 6X5.8 VLCR STR LF (GAUZE/BANDAGES/DRESSINGS) ×2 IMPLANT
BOWL SMART MIX CTS (DISPOSABLE) ×2 IMPLANT
CEMENT HV SMART SET (Cement) ×4 IMPLANT
COVER SURGICAL LIGHT HANDLE (MISCELLANEOUS) ×2 IMPLANT
CUFF TOURN SGL QUICK 34 (TOURNIQUET CUFF) ×2
CUFF TRNQT CYL 34X4.125X (TOURNIQUET CUFF) ×1 IMPLANT
DECANTER SPIKE VIAL GLASS SM (MISCELLANEOUS) ×2 IMPLANT
DRAPE INCISE IOBAN 66X45 STRL (DRAPES) ×2 IMPLANT
DRAPE U-SHAPE 47X51 STRL (DRAPES) ×2 IMPLANT
DRSG AQUACEL AG ADV 3.5X10 (GAUZE/BANDAGES/DRESSINGS) ×2 IMPLANT
DURAPREP 26ML APPLICATOR (WOUND CARE) ×2 IMPLANT
ELECT REM PT RETURN 15FT ADLT (MISCELLANEOUS) ×2 IMPLANT
GLOVE SRG 8 PF TXTR STRL LF DI (GLOVE) ×1 IMPLANT
GLOVE SURG ENC MOIS LTX SZ6.5 (GLOVE) ×2 IMPLANT
GLOVE SURG ENC MOIS LTX SZ8 (GLOVE) ×4 IMPLANT
GLOVE SURG UNDER POLY LF SZ7 (GLOVE) ×2 IMPLANT
GLOVE SURG UNDER POLY LF SZ8 (GLOVE) ×2
GLOVE SURG UNDER POLY LF SZ8.5 (GLOVE) ×2 IMPLANT
GOWN STRL REUS W/TWL LRG LVL3 (GOWN DISPOSABLE) ×4 IMPLANT
GOWN STRL REUS W/TWL XL LVL3 (GOWN DISPOSABLE) ×2 IMPLANT
HANDPIECE INTERPULSE COAX TIP (DISPOSABLE) ×2
HOLDER FOLEY CATH W/STRAP (MISCELLANEOUS) ×2 IMPLANT
IMMOBILIZER KNEE 20 (SOFTGOODS) ×2
IMMOBILIZER KNEE 20 THIGH 36 (SOFTGOODS) ×1 IMPLANT
KIT TURNOVER KIT A (KITS) IMPLANT
MANIFOLD NEPTUNE II (INSTRUMENTS) ×2 IMPLANT
NS IRRIG 1000ML POUR BTL (IV SOLUTION) ×2 IMPLANT
PACK TOTAL KNEE CUSTOM (KITS) ×2 IMPLANT
PADDING CAST COTTON 6X4 STRL (CAST SUPPLIES) ×2 IMPLANT
PROTECTOR NERVE ULNAR (MISCELLANEOUS) ×2 IMPLANT
SET HNDPC FAN SPRY TIP SCT (DISPOSABLE) ×1 IMPLANT
SPONGE T-LAP 18X18 ~~LOC~~+RFID (SPONGE) ×4 IMPLANT
STRIP CLOSURE SKIN 1/2X4 (GAUZE/BANDAGES/DRESSINGS) ×4 IMPLANT
SUT MNCRL AB 4-0 PS2 18 (SUTURE) ×2 IMPLANT
SUT STRATAFIX 0 PDS 27 VIOLET (SUTURE) ×2
SUT VIC AB 2-0 CT1 27 (SUTURE) ×6
SUT VIC AB 2-0 CT1 TAPERPNT 27 (SUTURE) ×3 IMPLANT
SUTURE STRATFX 0 PDS 27 VIOLET (SUTURE) ×1 IMPLANT
TIBIAL BASE ROT PLAT SZ 7 KNEE (Knees) ×2 IMPLANT
TRAY FOLEY MTR SLVR 16FR STAT (SET/KITS/TRAYS/PACK) ×2 IMPLANT
TUBE SUCTION HIGH CAP CLEAR NV (SUCTIONS) ×2 IMPLANT
WATER STERILE IRR 1000ML POUR (IV SOLUTION) IMPLANT
WRAP KNEE MAXI GEL POST OP (GAUZE/BANDAGES/DRESSINGS) ×2 IMPLANT

## 2021-07-17 NOTE — Anesthesia Procedure Notes (Signed)
Procedure Name: MAC Date/Time: 07/17/2021 7:20 AM Performed by: Niel Hummer, CRNA Pre-anesthesia Checklist: Patient identified, Emergency Drugs available, Suction available and Patient being monitored Oxygen Delivery Method: Simple face mask

## 2021-07-17 NOTE — Transfer of Care (Signed)
Immediate Anesthesia Transfer of Care Note  Patient: Austin Barker  Procedure(s) Performed: TOTAL KNEE ARTHROPLASTY (Right: Knee)  Patient Location: PACU  Anesthesia Type:Spinal  Level of Consciousness: awake, alert  and oriented  Airway & Oxygen Therapy: Patient Spontanous Breathing and Patient connected to face mask oxygen  Post-op Assessment: Report given to RN and Post -op Vital signs reviewed and stable  Post vital signs: Reviewed and stable  Last Vitals:  Vitals Value Taken Time  BP 103/72   Temp    Pulse 82   Resp 10   SpO2 99     Last Pain:  Vitals:   07/17/21 0534  TempSrc: Oral  PainSc:          Complications: No notable events documented.

## 2021-07-17 NOTE — Anesthesia Postprocedure Evaluation (Signed)
Anesthesia Post Note  Patient: Austin Barker  Procedure(s) Performed: TOTAL KNEE ARTHROPLASTY (Right: Knee)     Patient location during evaluation: PACU Anesthesia Type: Spinal Level of consciousness: oriented and awake and alert Pain management: pain level controlled Vital Signs Assessment: post-procedure vital signs reviewed and stable Respiratory status: spontaneous breathing, respiratory function stable and patient connected to nasal cannula oxygen Cardiovascular status: blood pressure returned to baseline and stable Postop Assessment: no headache, no backache and no apparent nausea or vomiting Anesthetic complications: no   No notable events documented.  Last Vitals:  Vitals:   07/17/21 0930 07/17/21 0945  BP: 109/72 126/83  Pulse: (!) 58 65  Resp: 15 14  Temp:    SpO2: 95% 97%    Last Pain:  Vitals:   07/17/21 0930  TempSrc:   PainSc: 0-No pain                 Genecis Veley S

## 2021-07-17 NOTE — Anesthesia Procedure Notes (Signed)
Anesthesia Procedure Image    

## 2021-07-17 NOTE — Evaluation (Signed)
Physical Therapy Evaluation Patient Details Name: Austin Barker MRN: [id number] DOB: 1961-03-09 Today's Date: 07/17/2021  History of Present Illness  Patient is 60 y.o. male s/p Rt TKA on 07/17/21 with PMH significant for Afib, CAD, HTN, GERD, gout, obesity, OSA, Lt TKA in 2020.    Clinical Impression  Austin Barker is a 60 y.o. male POD 0 s/p Rt TKA. Patient reports independence with mobility at baseline. Patient is now limited by functional impairments (see PT problem list below) and requires min assist/guard for transfers and gait with RW. Patient was able to ambulate ~60 feet with RW and min guard/assist. Patient instructed in exercise to facilitate ROM and circulation to manage edema and reduce risk of DVT. Patient will benefit from continued skilled PT interventions to address impairments and progress towards PLOF. Acute PT will follow to progress mobility and stair training in preparation for safe discharge home.        Recommendations for follow up therapy are one component of a multi-disciplinary discharge planning process, led by the attending physician.  Recommendations may be updated based on patient status, additional functional criteria and insurance authorization.  Follow Up Recommendations Follow physician's recommendations for discharge plan and follow up therapies    Assistance Recommended at Discharge Intermittent Supervision/Assistance  Functional Status Assessment Patient has had a recent decline in their functional status and demonstrates the ability to make significant improvements in function in a reasonable and predictable amount of time.  Equipment Recommendations       Recommendations for Other Services       Precautions / Restrictions Precautions Precautions: Fall Restrictions Weight Bearing Restrictions: No Other Position/Activity Restrictions: WBAT      Mobility  Bed Mobility Overal bed mobility: Needs Assistance Bed Mobility: Supine to Sit      Supine to sit: Min guard;HOB elevated     General bed mobility comments: cues to use bed rail, no assist to bring LEs off EOB.    Transfers Overall transfer level: Needs assistance Equipment used: Rolling walker (2 wheels) Transfers: Sit to/from Stand Sit to Stand: Min guard;From elevated surface           General transfer comment: cues for hand placement and technique with power up from EOB. pt steady once standing.    Ambulation/Gait Ambulation/Gait assistance: Min assist;Min guard Gait Distance (Feet): 60 Feet Assistive device: Rolling walker (2 wheels) Gait Pattern/deviations: Step-to pattern;Decreased stride length;Decreased weight shift to right;Decreased stance time - right Gait velocity: decr     General Gait Details: cues for step to pattern and proximity to RW, no overt LOB noted and pt with no buckling at Rt knee. assist at start to manage walker position.  Stairs            Wheelchair Mobility    Modified Rankin (Stroke Patients Only)       Balance Overall balance assessment: Needs assistance Sitting-balance support: Feet supported Sitting balance-Leahy Scale: Good     Standing balance support: During functional activity;Bilateral upper extremity supported Standing balance-Leahy Scale: Fair                               Pertinent Vitals/Pain Pain Assessment: 0-10 Pain Score: 7  Pain Location: Rt knee Pain Descriptors / Indicators: Aching;Discomfort Pain Intervention(s): Limited activity within patient's tolerance;Monitored during session;Repositioned;Ice applied    Home Living Family/patient expects to be discharged to:: Private residence Living Arrangements: Spouse/significant other Available Help at Discharge:  Family Type of Home: House Home Access: Stairs to enter Entrance Stairs-Rails: Right;Left;Can reach both Entrance Stairs-Number of Steps: 2 Alternate Level Stairs-Number of Steps: chair lift Home Layout: Two  level Home Equipment: Rollator (4 wheels);Tub bench;Hand held shower head      Prior Function Prior Level of Function : Independent/Modified Independent                     Hand Dominance   Dominant Hand: Right    Extremity/Trunk Assessment   Upper Extremity Assessment Upper Extremity Assessment: Overall WFL for tasks assessed    Lower Extremity Assessment Lower Extremity Assessment: RLE deficits/detail RLE Deficits / Details: good quad activation, no extensor lag with SLR RLE Sensation: WNL RLE Coordination: WNL    Cervical / Trunk Assessment Cervical / Trunk Assessment: Normal  Communication   Communication: No difficulties  Cognition Arousal/Alertness: Awake/alert Behavior During Therapy: WFL for tasks assessed/performed Overall Cognitive Status: Within Functional Limits for tasks assessed                                          General Comments      Exercises Total Joint Exercises Ankle Circles/Pumps: AROM;Both;20 reps;Seated Quad Sets: AROM;10 reps;Seated Heel Slides: AROM;10 reps;Seated   Assessment/Plan    PT Assessment Patient needs continued PT services  PT Problem List Decreased strength;Decreased range of motion;Decreased balance;Decreased activity tolerance;Decreased mobility;Decreased knowledge of use of DME;Decreased knowledge of precautions;Pain       PT Treatment Interventions DME instruction;Gait training;Stair training;Functional mobility training;Therapeutic activities;Therapeutic exercise;Balance training;Patient/family education    PT Goals (Current goals can be found in the Care Plan section)  Acute Rehab PT Goals Patient Stated Goal: get back to walking without pain and go to [place] for work PT Goal Formulation: With patient Time For Goal Achievement: 07/24/21 Potential to Achieve Goals: Good    Frequency 7X/week   Barriers to discharge        Co-evaluation               AM-PAC PT "6 Clicks"  Mobility  Outcome Measure Help needed turning from your back to your side while in a flat bed without using bedrails?: A Little Help needed moving from lying on your back to sitting on the side of a flat bed without using bedrails?: A Little Help needed moving to and from a bed to a chair (including a wheelchair)?: A Little Help needed standing up from a chair using your arms (e.g., wheelchair or bedside chair)?: A Little Help needed to walk in hospital room?: A Little Help needed climbing 3-5 steps with a railing? : A Little 6 Click Score: 18    End of Session Equipment Utilized During Treatment: Gait belt Activity Tolerance: Patient tolerated treatment well Patient left: in chair;with call bell/phone within reach;with chair alarm set;with family/visitor present Nurse Communication: Mobility status PT Visit Diagnosis: Muscle weakness (generalized) (M62.81);Difficulty in walking, not elsewhere classified (R26.2)    Time: 7096-2836 PT Time Calculation (min) (ACUTE ONLY): 27 min   Charges:   PT Evaluation $PT Eval Low Complexity: 1 Low PT Treatments $Gait Training: 8-22 mins        Wynn Maudlin, DPT Acute Rehabilitation Services Office [phone] Pager [phone]   Anitra Lauth 07/17/2021, 11:55 AM

## 2021-07-17 NOTE — Discharge Instructions (Addendum)
 Frank Aluisio, MD Total Joint Specialist [facility] [address] [place], [place] [address] [phone]  TOTAL KNEE REPLACEMENT POSTOPERATIVE DIRECTIONS    Knee Rehabilitation, Guidelines Following Surgery  Results after knee surgery are often greatly improved when you follow the exercise, range of motion and muscle strengthening exercises prescribed by your doctor. Safety measures are also important to protect the knee from further injury. If any of these exercises cause you to have increased pain or swelling in your knee joint, decrease the amount until you are comfortable again and slowly increase them. If you have problems or questions, call your caregiver or physical therapist for advice.   BLOOD CLOT PREVENTION Take a 10 mg Xarelto once a day for three weeks following surgery. Then take an 81 mg Aspirin once a day for three weeks. Then discontinue Aspirin. You may resume your vitamins/supplements once you have discontinued the Xarelto. Do not take any NSAIDs (Advil, Aleve, Ibuprofen, Meloxicam, etc.) until you have discontinued the Xarelto.   HOME CARE INSTRUCTIONS  Remove items at home which could result in a fall. This includes throw rugs or furniture in walking pathways.  ICE to the affected knee as much as tolerated. Icing helps control swelling. If the swelling is well controlled you will be more comfortable and rehab easier. Continue to use ice on the knee for pain and swelling from surgery. You may notice swelling that will progress down to the foot and ankle. This is normal after surgery. Elevate the leg when you are not up walking on it.    Continue to use the breathing machine which will help keep your temperature down. It is common for your temperature to cycle up and down following surgery, especially at night when you are not up moving around and exerting yourself. The breathing machine keeps your lungs expanded and your temperature  down. Do not place pillow under the operative knee, focus on keeping the knee straight while resting  DIET You may resume your previous home diet once you are discharged from the hospital.  DRESSING / WOUND CARE / SHOWERING Keep your bulky bandage on for 2 days. On the third post-operative day you may remove the Ace bandage and gauze. There is a waterproof adhesive bandage on your skin which will stay in place until your first follow-up appointment. Once you remove this you will not need to place another bandage You may begin showering 3 days following surgery, but do not submerge the incision under water.  ACTIVITY For the first 5 days, the key is rest and control of pain and swelling Do your home exercises twice a day starting on post-operative day 3. On the days you go to physical therapy, just do the home exercises once that day. You should rest, ice and elevate the leg for 50 minutes out of every hour. Get up and walk/stretch for 10 minutes per hour. After 5 days you can increase your activity slowly as tolerated. Walk with your walker as instructed. Use the walker until you are comfortable transitioning to a cane. Walk with the cane in the opposite hand of the operative leg. You may discontinue the cane once you are comfortable and walking steadily. Avoid periods of inactivity such as sitting longer than an hour when not asleep. This helps prevent blood clots.  You may discontinue the knee immobilizer once you are able to perform a straight leg raise while lying down. You may resume a sexual relationship in one month or   when given the OK by your doctor.  You may return to work once you are cleared by your doctor.  Do not drive a car for 6 weeks or until released by your surgeon.  Do not drive while taking narcotics.  TED HOSE STOCKINGS Wear the elastic stockings on both legs for three weeks following surgery during the day. You may remove them at night for sleeping.  WEIGHT  BEARING Weight bearing as tolerated with assist device (walker, cane, etc) as directed, use it as long as suggested by your surgeon or therapist, typically at least 4-6 weeks.  POSTOPERATIVE CONSTIPATION PROTOCOL Constipation - defined medically as fewer than three stools per week and severe constipation as less than one stool per week.  One of the most common issues patients have following surgery is constipation.  Even if you have a regular bowel pattern at home, your normal regimen is likely to be disrupted due to multiple reasons following surgery.  Combination of anesthesia, postoperative narcotics, change in appetite and fluid intake all can affect your bowels.  In order to avoid complications following surgery, here are some recommendations in order to help you during your recovery period.  Colace (docusate) - Pick up an over-the-counter form of Colace or another stool softener and take twice a day as long as you are requiring postoperative pain medications.  Take with a full glass of water daily.  If you experience loose stools or diarrhea, hold the colace until you stool forms back up. If your symptoms do not get better within 1 week or if they get worse, check with your doctor. Dulcolax (bisacodyl) - Pick up over-the-counter and take as directed by the product packaging as needed to assist with the movement of your bowels.  Take with a full glass of water.  Use this product as needed if not relieved by Colace only.  MiraLax (polyethylene glycol) - Pick up over-the-counter to have on hand. MiraLax is a solution that will increase the amount of water in your bowels to assist with bowel movements.  Take as directed and can mix with a glass of water, juice, soda, coffee, or tea. Take if you go more than two days without a movement. Do not use MiraLax more than once per day. Call your doctor if you are still constipated or irregular after using this medication for 7 days in a row.  If you continue  to have problems with postoperative constipation, please contact the office for further assistance and recommendations.  If you experience "the worst abdominal pain ever" or develop nausea or vomiting, please contact the office immediatly for further recommendations for treatment.  ITCHING If you experience itching with your medications, try taking only a single pain pill, or even half a pain pill at a time.  You can also use Benadryl over the counter for itching or also to help with sleep.   MEDICATIONS See your medication summary on the "After Visit Summary" that the nursing staff will review with you prior to discharge.  You may have some home medications which will be placed on hold until you complete the course of blood thinner medication.  It is important for you to complete the blood thinner medication as prescribed by your surgeon.  Continue your approved medications as instructed at time of discharge.  PRECAUTIONS If you experience chest pain or shortness of breath - call 911 immediately for transfer to the hospital emergency department.  If you develop a fever greater that 101 F, purulent   drainage from wound, increased redness or drainage from wound, foul odor from the wound/dressing, or calf pain - CONTACT YOUR SURGEON.                                                   FOLLOW-UP APPOINTMENTS Make sure you keep all of your appointments after your operation with your surgeon and caregivers. You should call the office at the above phone number and make an appointment for approximately two weeks after the date of your surgery or on the date instructed by your surgeon outlined in the "After Visit Summary".  RANGE OF MOTION AND STRENGTHENING EXERCISES  Rehabilitation of the knee is important following a knee injury or an operation. After just a few days of immobilization, the muscles of the thigh which control the knee become weakened and shrink (atrophy). Knee exercises are designed to build up  the tone and strength of the thigh muscles and to improve knee motion. Often times heat used for twenty to thirty minutes before working out will loosen up your tissues and help with improving the range of motion but do not use heat for the first two weeks following surgery. These exercises can be done on a training (exercise) mat, on the floor, on a table or on a bed. Use what ever works the best and is most comfortable for you Knee exercises include:  Leg Lifts - While your knee is still immobilized in a splint or cast, you can do straight leg raises. Lift the leg to 60 degrees, hold for 3 sec, and slowly lower the leg. Repeat 10-20 times 2-3 times daily. Perform this exercise against resistance later as your knee gets better.  Quad and Hamstring Sets - Tighten up the muscle on the front of the thigh (Quad) and hold for 5-10 sec. Repeat this 10-20 times hourly. Hamstring sets are done by pushing the foot backward against an object and holding for 5-10 sec. Repeat as with quad sets.  Leg Slides: Lying on your back, slowly slide your foot toward your buttocks, bending your knee up off the floor (only go as far as is comfortable). Then slowly slide your foot back down until your leg is flat on the floor again. Angel Wings: Lying on your back spread your legs to the side as far apart as you can without causing discomfort.  A rehabilitation program following serious knee injuries can speed recovery and prevent re-injury in the future due to weakened muscles. Contact your doctor or a physical therapist for more information on knee rehabilitation.   POST-OPERATIVE OPIOID TAPER INSTRUCTIONS: It is important to wean off of your opioid medication as soon as possible. If you do not need pain medication after your surgery it is ok to stop day one. Opioids include: Codeine, Hydrocodone(Norco, Vicodin), Oxycodone(Percocet, oxycontin) and hydromorphone amongst others.  Long term and even short term use of opiods can  cause: Increased pain response Dependence Constipation Depression Respiratory depression And more.  Withdrawal symptoms can include Flu like symptoms Nausea, vomiting And more Techniques to manage these symptoms Hydrate well Eat regular healthy meals Stay active Use relaxation techniques(deep breathing, meditating, yoga) Do Not substitute Alcohol to help with tapering If you have been on opioids for less than two weeks and do not have pain than it is ok to stop all together.  Plan to   wean off of opioids This plan should start within one week post op of your joint replacement. Maintain the same interval or time between taking each dose and first decrease the dose.  Cut the total daily intake of opioids by one tablet each day Next start to increase the time between doses. The last dose that should be eliminated is the evening dose.   IF YOU ARE TRANSFERRED TO A SKILLED REHAB FACILITY If the patient is transferred to a skilled rehab facility following release from the hospital, a list of the current medications will be sent to the facility for the patient to continue.  When discharged from the skilled rehab facility, please have the facility set up the patient's Home Health Physical Therapy prior to being released. Also, the skilled facility will be responsible for providing the patient with their medications at time of release from the facility to include their pain medication, the muscle relaxants, and their blood thinner medication. If the patient is still at the rehab facility at time of the two week follow up appointment, the skilled rehab facility will also need to assist the patient in arranging follow up appointment in our office and any transportation needs.  MAKE SURE YOU:  Understand these instructions.  Get help right away if you are not doing well or get worse.   DENTAL ANTIBIOTICS:  In most cases prophylactic antibiotics for Dental procdeures after total joint surgery are  not necessary.  Exceptions are as follows:  1. History of prior total joint infection  2. Severely immunocompromised (Organ Transplant, cancer chemotherapy, Rheumatoid biologic meds such as Humera)  3. Poorly controlled diabetes (A1C &gt; 8.0, blood glucose over 200)  If you have one of these conditions, contact your surgeon for an antibiotic prescription, prior to your dental procedure.    Pick up stool softner and laxative for home use following surgery while on pain medications. Do not submerge incision under water. Please use good hand washing techniques while changing dressing each day. May shower starting three days after surgery. Please use a clean towel to pat the incision dry following showers. Continue to use ice for pain and swelling after surgery. Do not use any lotions or creams on the incision until instructed by your surgeon.  Information on my medicine - XARELTO (Rivaroxaban)  Why was Xarelto prescribed for you? Xarelto was prescribed for you to reduce the risk of blood clots forming after orthopedic surgery. The medical term for these abnormal blood clots is venous thromboembolism (VTE).  What do you need to know about xarelto ? Take your Xarelto ONCE DAILY at the same time every day. You may take it either with or without food.  If you have difficulty swallowing the tablet whole, you may crush it and mix in applesauce just prior to taking your dose.  Take Xarelto exactly as prescribed by your doctor and DO NOT stop taking Xarelto without talking to the doctor who prescribed the medication.  Stopping without other VTE prevention medication to take the place of Xarelto may increase your risk of developing a clot.  After discharge, you should have regular check-up appointments with your healthcare provider that is prescribing your Xarelto.    What do you do if you miss a dose? If you miss a dose, take it as soon as you remember on the same day then  continue your regularly scheduled once daily regimen the next day. Do not take two doses of Xarelto on the same day.   Important   Safety Information A possible side effect of Xarelto is bleeding. You should call your healthcare provider right away if you experience any of the following: Bleeding from an injury or your nose that does not stop. Unusual colored urine (red or dark brown) or unusual colored stools (red or black). Unusual bruising for unknown reasons. A serious fall or if you hit your head (even if there is no bleeding).  Some medicines may interact with Xarelto and might increase your risk of bleeding while on Xarelto. To help avoid this, consult your healthcare provider or pharmacist prior to using any new prescription or non-prescription medications, including herbals, vitamins, non-steroidal anti-inflammatory drugs (NSAIDs) and supplements.  This website has more information on Xarelto: www.xarelto.com.    

## 2021-07-17 NOTE — Anesthesia Procedure Notes (Signed)
Anesthesia Regional Block: Adductor canal block   Pre-Anesthetic Checklist: , timeout performed,  Correct Patient, Correct Site, Correct Laterality,  Correct Procedure, Correct Position, site marked,  Risks and benefits discussed,  Surgical consent,  Pre-op evaluation,  At surgeon's request and post-op pain management  Laterality: Right  Prep: chloraprep       Needles:  Injection technique: Single-shot  Needle Type: Echogenic Needle     Needle Length: 9cm      Additional Needles:   Procedures:,,,, ultrasound used (permanent image in chart),,    Narrative:  Start time: 07/17/2021 6:42 AM End time: 07/17/2021 6:47 AM Injection made incrementally with aspirations every 5 mL.  Performed by: Personally  Anesthesiologist: Eilene Ghazi, MD  Additional Notes: Patient tolerated the procedure well without complications

## 2021-07-17 NOTE — Progress Notes (Signed)
Orthopedic Tech Progress Note Patient Details:  Austin Barker [birth year] [id number]  CPM Right Knee CPM Right Knee: On Right Knee Flexion (Degrees): 40 Right Knee Extension (Degrees): 10 Additional Comments: Right  Post Interventions Patient Tolerated: Well Instructions Provided: Care of device, Adjustment of device  Saul Fordyce 07/17/2021, 9:00 AM

## 2021-07-17 NOTE — Op Note (Signed)
OPERATIVE REPORT-TOTAL KNEE ARTHROPLASTY   Pre-operative diagnosis- Osteoarthritis  Right knee(s)  Post-operative diagnosis- Osteoarthritis Right knee(s)  Procedure-  Right  Total Knee Arthroplasty  Surgeon- Gus Rankin. Kamika Goodloe, MD  Assistant- Leilani Able, PA-C   Anesthesia-   Adductor canal block and spinal  EBL- 25 ml   Drains None  Tourniquet time-  Total Tourniquet Time Documented: Thigh (Right) - 40 minutes Total: Thigh (Right) - 40 minutes     Complications- None  Condition-PACU - hemodynamically stable.   Brief Clinical Note  Austin Barker is a 60 y.o. year old male with end stage OA of his right knee with progressively worsening pain and dysfunction. He has constant pain, with activity and at rest and significant functional deficits with difficulties even with ADLs. He has had extensive non-op management including analgesics, injections of cortisone and viscosupplements, and home exercise program, but remains in significant pain with significant dysfunction. Radiographs show bone on bone arthritis medial and patellofemoral. He presents now for right Total Knee Arthroplasty.     Procedure in detail---   The patient is brought into the operating room and positioned supine on the operating table. After successful administration of  Adductor canal block and spinal,   a tourniquet is placed high on the  Right thigh(s) and the lower extremity is prepped and draped in the usual sterile fashion. Time out is performed by the operating team and then the  Right lower extremity is wrapped in Esmarch, knee flexed and the tourniquet inflated to 300 mmHg.       A midline incision is made with a ten blade through the subcutaneous tissue to the level of the extensor mechanism. A fresh blade is used to make a medial parapatellar arthrotomy. Soft tissue over the proximal medial tibia is subperiosteally elevated to the joint line with a knife and into the semimembranosus bursa with a Cobb  elevator. Soft tissue over the proximal lateral tibia is elevated with attention being paid to avoiding the patellar tendon on the tibial tubercle. The patella is everted, knee flexed 90 degrees and the ACL and PCL are removed. Findings are bone on bone medial and patellofemoral with large global osteophytes.        The drill is used to create a starting hole in the distal femur and the canal is thoroughly irrigated with sterile saline to remove the fatty contents. The 5 degree Right  valgus alignment guide is placed into the femoral canal and the distal femoral cutting block is pinned to remove 9 mm off the distal femur. Resection is made with an oscillating saw.      The tibia is subluxed forward and the menisci are removed. The extramedullary alignment guide is placed referencing proximally at the medial aspect of the tibial tubercle and distally along the second metatarsal axis and tibial crest. The block is pinned to remove 34mm off the more deficient medial  side. Resection is made with an oscillating saw. Size 7is the most appropriate size for the tibia and the proximal tibia is prepared with the modular drill and keel punch for that size.      The femoral sizing guide is placed and size 8 is most appropriate. Rotation is marked off the epicondylar axis and confirmed by creating a rectangular flexion gap at 90 degrees. The size 8 cutting block is pinned in this rotation and the anterior, posterior and chamfer cuts are made with the oscillating saw. The intercondylar block is then placed and that cut  is made.      Trial size 7 tibial component, trial size 8 posterior stabilized femur and a 12  mm posterior stabilized rotating platform insert trial is placed. Full extension is achieved with excellent varus/valgus and anterior/posterior balance throughout full range of motion. The patella is everted and thickness measured to be 27  mm. Free hand resection is taken to 15 mm, a 41 template is placed, lug holes  are drilled, trial patella is placed, and it tracks normally. Osteophytes are removed off the posterior femur with the trial in place. All trials are removed and the cut bone surfaces prepared with pulsatile lavage. Cement is mixed and once ready for implantation, the size 7 tibial implant, size  8 posterior stabilized femoral component, and the size 41 patella are cemented in place and the patella is held with the clamp. The trial insert is placed and the knee held in full extension. The Exparel (20 ml mixed with 60 ml saline) is injected into the extensor mechanism, posterior capsule, medial and lateral gutters and subcutaneous tissues.  All extruded cement is removed and once the cement is hard the permanent 12 mm posterior stabilized rotating platform insert is placed into the tibial tray.      The wound is copiously irrigated with saline solution and the extensor mechanism closed with # 0 Stratofix suture. The tourniquet is released for a total tourniquet time of 39  minutes. Flexion against gravity is 140 degrees and the patella tracks normally. Subcutaneous tissue is closed with 2.0 vicryl and subcuticular with running 4.0 Monocryl. The incision is cleaned and dried and steri-strips and a bulky sterile dressing are applied. The limb is placed into a knee immobilizer and the patient is awakened and transported to recovery in stable condition.      Please note that a surgical assistant was a medical necessity for this procedure in order to perform it in a safe and expeditious manner. Surgical assistant was necessary to retract the ligaments and vital neurovascular structures to prevent injury to them and also necessary for proper positioning of the limb to allow for anatomic placement of the prosthesis.   [name], MD    07/17/2021, 8:29 AM

## 2021-07-17 NOTE — Interval H&P Note (Signed)
History and Physical Interval Note:  07/17/2021 6:28 AM  Austin Barker  has presented today for surgery, with the diagnosis of right knee osteoarthritis.  The various methods of treatment have been discussed with the patient and family. After consideration of risks, benefits and other options for treatment, the patient has consented to  Procedure(s): TOTAL KNEE ARTHROPLASTY (Right) as a surgical intervention.  The patient's history has been reviewed, patient examined, no change in status, stable for surgery.  I have reviewed the patient's chart and labs.  Questions were answered to the patient's satisfaction.     Homero Fellers [name]

## 2021-07-18 ENCOUNTER — Encounter (HOSPITAL_COMMUNITY): Payer: Self-pay | Admitting: Orthopedic Surgery

## 2021-07-18 DIAGNOSIS — M1711 Unilateral primary osteoarthritis, right knee: Secondary | ICD-10-CM | POA: Diagnosis not present

## 2021-07-18 LAB — CBC
HCT: 36 % — ABNORMAL LOW (ref 39.0–52.0)
Hemoglobin: 12.5 g/dL — ABNORMAL LOW (ref 13.0–17.0)
MCH: 32.1 pg (ref 26.0–34.0)
MCHC: 34.7 g/dL (ref 30.0–36.0)
MCV: 92.3 fL (ref 80.0–100.0)
Platelets: 187 10*3/uL (ref 150–400)
RBC: 3.9 MIL/uL — ABNORMAL LOW (ref 4.22–5.81)
RDW: 13 % (ref 11.5–15.5)
WBC: 15.9 10*3/uL — ABNORMAL HIGH (ref 4.0–10.5)
nRBC: 0 % (ref 0.0–0.2)

## 2021-07-18 LAB — BASIC METABOLIC PANEL
Anion gap: 8 (ref 5–15)
BUN: 18 mg/dL (ref 8–23)
CO2: 27 mmol/L (ref 22–32)
Calcium: 8.7 mg/dL — ABNORMAL LOW (ref 8.9–10.3)
Chloride: 103 mmol/L (ref 98–111)
Creatinine, Ser: 1.43 mg/dL — ABNORMAL HIGH (ref 0.61–1.24)
GFR, Estimated: 56 mL/min — ABNORMAL LOW (ref >60)
Glucose, Bld: 202 mg/dL — ABNORMAL HIGH (ref 70–99)
Potassium: 3.8 mmol/L (ref 3.5–5.1)
Sodium: 138 mmol/L (ref 135–145)

## 2021-07-18 MED ORDER — TRAMADOL HCL 50 MG PO TABS
50.0000 mg | ORAL_TABLET | Freq: Four times a day (QID) | ORAL | 0 refills | Status: DC | PRN
Start: 2021-07-18 — End: 2023-01-25

## 2021-07-18 MED ORDER — RIVAROXABAN 10 MG PO TABS
10.0000 mg | ORAL_TABLET | Freq: Every day | ORAL | 0 refills | Status: DC
Start: 2021-07-18 — End: 2021-08-07

## 2021-07-18 MED ORDER — GABAPENTIN 300 MG PO CAPS: ORAL_CAPSULE | ORAL | 0 refills | Status: DC

## 2021-07-18 MED ORDER — OXYCODONE HCL 5 MG PO TABS: 5.0000 mg | ORAL_TABLET | Freq: Four times a day (QID) | ORAL | 0 refills | Status: DC | PRN

## 2021-07-18 MED ORDER — METHOCARBAMOL 500 MG PO TABS: 500.0000 mg | ORAL_TABLET | Freq: Four times a day (QID) | ORAL | 0 refills | Status: DC | PRN

## 2021-07-18 NOTE — TOC Transition Note (Signed)
Transition of Care [facility]) - CM/SW Discharge Note  Patient Details  Name: MOYSES PAVEY MRN: [id number] Date of Birth: Aug 04, 1961  Transition of Care [facility]) CM/SW Contact:  Sherie Don, LCSW Phone Number: 07/18/2021, 10:10 AM  Clinical Narrative: Patient is expected to discharge home after working with PT. CSW met with patient to confirm discharge plan. Patient will discharge home with OPPT at [facility]. Patient has a rolling walker, cane, and elevated toilets at home so there are no DME needs at this time. TOC signing off.  Final next level of care: OP Rehab Barriers to Discharge: No Barriers Identified  Patient Goals and CMS Choice Patient states their goals for this hospitalization and ongoing recovery are:: Discharge home with OPPT Choice offered to / list presented to : NA  Discharge Plan and Services      DME Arranged: N/A DME Agency: NA  Readmission Risk Interventions No flowsheet data found.

## 2021-07-18 NOTE — Progress Notes (Signed)
Patient discharged to home w/ family. Given all belongings, instructions. Verbalized understanding of instructions. Escorted to pov via w/c. 

## 2021-07-18 NOTE — Progress Notes (Signed)
Physical Therapy Treatment Patient Details Name: Austin Barker MRN: [id number] DOB: [birth year] Today's Date: 07/18/2021   History of Present Illness Patient is 60 y.o. male s/p Rt TKA on 07/17/21 with PMH significant for Afib, CAD, HTN, GERD, gout, obesity, OSA, Lt TKA in 2020.    PT Comments    Progressing well. Reviewed/practiced exercises, gait training, and stair training. All education completed. Okay to d/c from PT standpoint.    Recommendations for follow up therapy are one component of a multi-disciplinary discharge planning process, led by the attending physician.  Recommendations may be updated based on patient status, additional functional criteria and insurance authorization.  Follow Up Recommendations  Follow physician's recommendations for discharge plan and follow up therapies     Assistance Recommended at Discharge Intermittent Supervision/Assistance  Equipment Recommendations  None recommended by PT    Recommendations for Other Services       Precautions / Restrictions Precautions Precautions: Fall Restrictions Weight Bearing Restrictions: No Other Position/Activity Restrictions: WBAT     Mobility  Bed Mobility Overal bed mobility: Needs Assistance Bed Mobility: Supine to Sit;Sit to Supine     Supine to sit: Min guard Sit to supine: Min guard        Transfers Overall transfer level: Needs assistance Equipment used: Rolling walker (2 wheels) Transfers: Sit to/from Stand Sit to Stand: Min guard;From elevated surface           General transfer comment: Cues for hand placement.    Ambulation/Gait Ambulation/Gait assistance: Min guard Gait Distance (Feet): 65 Feet Assistive device: Rolling walker (2 wheels) Gait Pattern/deviations: Decreased stride length       General Gait Details: Min guard for safety. Pt denied dizziness.   Stairs Stairs: Yes Stairs assistance: Min guard Stair Management: Step to pattern;Forwards;Two  rails Number of Stairs: 2 General stair comments: up and over portable stairs x 1. cues for safety, technique, sequence.   Wheelchair Mobility    Modified Rankin (Stroke Patients Only)       Balance Overall balance assessment: Needs assistance         Standing balance support: Bilateral upper extremity supported Standing balance-Leahy Scale: Fair                              Cognition Arousal/Alertness: Awake/alert Behavior During Therapy: WFL for tasks assessed/performed Overall Cognitive Status: Within Functional Limits for tasks assessed                                          Exercises Total Joint Exercises Ankle Circles/Pumps: AROM;Both;10 reps Quad Sets: AROM;Both;10 reps Heel Slides: AAROM;Right;10 reps Hip ABduction/ADduction: Right;10 reps;AROM Straight Leg Raises: AROM;Right;10 reps Goniometric ROM: ~10-65 degrees    General Comments        Pertinent Vitals/Pain Pain Assessment: 0-10 Pain Score: 7  Pain Location: R knee Pain Descriptors / Indicators: Discomfort;Sore Pain Intervention(s): Monitored during session;Ice applied;Repositioned    Home Living                          Prior Function            PT Goals (current goals can now be found in the care plan section) Progress towards PT goals: Progressing toward goals    Frequency    7X/week  PT Plan Current plan remains appropriate    Co-evaluation              AM-PAC PT "6 Clicks" Mobility   Outcome Measure  Help needed turning from your back to your side while in a flat bed without using bedrails?: A Little Help needed moving from lying on your back to sitting on the side of a flat bed without using bedrails?: A Little Help needed moving to and from a bed to a chair (including a wheelchair)?: A Little Help needed standing up from a chair using your arms (e.g., wheelchair or bedside chair)?: A Little Help needed to walk in  hospital room?: A Little Help needed climbing 3-5 steps with a railing? : A Little 6 Click Score: 18    End of Session Equipment Utilized During Treatment: Gait belt Activity Tolerance: Patient tolerated treatment well Patient left: in bed;with call bell/phone within reach   PT Visit Diagnosis: Other abnormalities of gait and mobility (R26.89);Pain Pain - Right/Left: Right Pain - part of body: Knee     Time: [id number] PT Time Calculation (min) (ACUTE ONLY): 17 min  Charges:  $Gait Training: 8-22 mins                        Doreatha Massed, PT Acute Rehabilitation  Office: [phone] Pager: [phone]

## 2021-07-18 NOTE — Progress Notes (Signed)
   Subjective: 1 Day Post-Op Procedure(s) (LRB): TOTAL KNEE ARTHROPLASTY (Right) Patient reports pain as mild.   Patient seen in rounds by Dr. Lequita Halt. Patient is well, and has had no acute complaints or problems. States he is ready to go home. Denies chest pain or SOB. Foley catheter removed this AM. We will continue therapy today, ambulated 60' yesterday.   Objective: Vital signs in last 24 hours: Temp:  [97.4 F (36.3 C)-99 F (37.2 C)] 97.8 F (36.6 C) (11/15 0553) Pulse Rate:  [55-88] 76 (11/15 0553) Resp:  [11-20] 17 (11/15 0553) BP: (109-154)/(72-88) 154/79 (11/15 0553) SpO2:  [94 %-99 %] 99 % (11/15 0553)  Intake/Output from previous day:  Intake/Output Summary (Last 24 hours) at 07/18/2021 0723 Last data filed at 07/18/2021 0704 Gross per 24 hour  Intake 2774.46 ml  Output 2270 ml  Net 504.46 ml     Intake/Output this shift: Total I/O In: 80 [I.V.:80] Out: -   Labs: Recent Labs    07/18/21 0308  HGB 12.5*   Recent Labs    07/18/21 0308  WBC 15.9*  RBC 3.90*  HCT 36.0*  PLT 187   Recent Labs    07/18/21 0308  NA 138  K 3.8  CL 103  CO2 27  BUN 18  CREATININE 1.43*  GLUCOSE 202*  CALCIUM 8.7*   No results for input(s): LABPT, INR in the last 72 hours.  Exam: General - Patient is Alert and Oriented Extremity - Neurologically intact Neurovascular intact Sensation intact distally Dorsiflexion/Plantar flexion intact Dressing - dressing C/D/I Motor Function - intact, moving foot and toes well on exam.   Past Medical History:  Diagnosis Date   A-fib (HCC)    ablation   Coronary artery disease    Dyslipidemia    Essential hypertension, benign    GERD (gastroesophageal reflux disease)    Gout, unspecified    History of kidney stones 2018   Morbid obesity (HCC)    OSA on CPAP     Assessment/Plan: 1 Day Post-Op Procedure(s) (LRB): TOTAL KNEE ARTHROPLASTY (Right) Active Problems:   Primary osteoarthritis of right knee  Estimated  body mass index is 38.57 kg/m as calculated from the following:   Height as of this encounter: 6\' 1"  (1.854 m).   Weight as of this encounter: 132.6 kg. Advance diet Up with therapy D/C IV fluids   Patient's anticipated LOS is less than 2 midnights, meeting these requirements: - Younger than 11 - Lives within 1 hour of care - Has a competent adult at home to recover with post-op recover - NO history of  - Chronic pain requiring opioids  - Diabetes  - Heart failure  - Heart attack  - Stroke  - DVT/VTE  - Respiratory Failure/COPD  - Renal failure  - Anemia  - Advanced Liver disease  DVT Prophylaxis - Xarelto Weight bearing as tolerated. Continue therapy.  Plan is to go Home after hospital stay. Plan for discharge today once cleared by PT. Scheduled for OPPT at [facility] Follow-up in the office in 2 weeks  The PDMP database was reviewed today prior to any opioid medications being prescribed to this patient.  75, PA-C Orthopedic Surgery [phone] 07/18/2021, 7:23 AM

## 2021-07-19 ENCOUNTER — Telehealth: Payer: Self-pay | Admitting: Cardiology

## 2021-07-19 MED ORDER — ATENOLOL 50 MG PO TABS: 50.0000 mg | ORAL_TABLET | Freq: Every day | ORAL | 0 refills | Status: DC

## 2021-07-19 NOTE — Telephone Encounter (Signed)
Pt's medication was sent to pt's pharmacy as requested. Confirmation received.  °

## 2021-07-19 NOTE — Telephone Encounter (Signed)
*  STAT* If patient is at the pharmacy, call can be transferred to refill team.   1. Which medications need to be refilled? (please list name of each medication and dose if known)  atenolol (TENORMIN) 50 MG tablet  2. Which pharmacy/location (including street and city if local pharmacy) is medication to be sent to? [id number] - [place], [place] - [address] AT [facility] OF [address]  3. Do they need a 30 day or 90 day supply?   90 day supply

## 2021-07-20 MED ORDER — BUPIVACAINE IN DEXTROSE 0.75-8.25 % IT SOLN
INTRATHECAL | Status: DC | PRN
  Administered 2021-07-17: 1.6 mL via INTRATHECAL

## 2021-07-20 NOTE — Addendum Note (Signed)
Addendum  created 07/20/21 0852 by Eilene Ghazi, MD   Child order released for a procedure order, Clinical Note Signed, Intraprocedure Blocks edited, Intraprocedure Meds edited, SmartForm saved

## 2021-07-20 NOTE — Anesthesia Procedure Notes (Signed)
Spinal  Patient location during procedure: OR Start time: 07/17/2021 7:21 AM End time: 07/17/2021 7:25 AM Reason for block: surgical anesthesia Staffing Performed: anesthesiologist  Anesthesiologist: Eilene Ghazi, MD Preanesthetic Checklist Completed: patient identified, IV checked, site marked, risks and benefits discussed, surgical consent, monitors and equipment checked, pre-op evaluation and timeout performed Spinal Block Patient position: sitting Prep: Betadine Patient monitoring: heart rate, continuous pulse ox and blood pressure Approach: midline Location: L3-4 Injection technique: single-shot Needle Needle type: Sprotte  Needle gauge: 24 G Needle length: 9 cm Assessment Sensory level: T6 Events: CSF return Additional Notes

## 2021-08-02 NOTE — Discharge Summary (Signed)
Physician Discharge Summary   Patient ID: Austin Barker MRN: [id number] DOB/AGE: [birth year] 60 y.o.  Admit date: 07/17/2021 Discharge date: 07/18/2021  Primary Diagnosis: Osteoarthritis, right knee   Admission Diagnoses:  Past Medical History:  Diagnosis Date   A-fib [facility])    ablation   Coronary artery disease    Dyslipidemia    Essential hypertension, benign    GERD (gastroesophageal reflux disease)    Gout, unspecified    History of kidney stones 2018   Morbid obesity (HCC)    OSA on CPAP    Discharge Diagnoses:   Active Problems:   Primary osteoarthritis of right knee  Estimated body mass index is 38.57 kg/m as calculated from the following:   Height as of this encounter: 6\' 1"  (1.854 m).   Weight as of this encounter: 132.6 kg.  Procedure:  Procedure(s) (LRB): TOTAL KNEE ARTHROPLASTY (Right)   Consults: None  HPI: Austin Barker is a 60 y.o. year old male with end stage OA of his right knee with progressively worsening pain and dysfunction. He has constant pain, with activity and at rest and significant functional deficits with difficulties even with ADLs. He has had extensive non-op management including analgesics, injections of cortisone and viscosupplements, and home exercise program, but remains in significant pain with significant dysfunction. Radiographs show bone on bone arthritis medial and patellofemoral. He presents now for right Total Knee Arthroplasty.     Laboratory Data: Admission on 07/17/2021, Discharged on 07/18/2021  Component Date Value Ref Range Status   WBC 07/18/2021 15.9 (H)  4.0 - 10.5 K/uL Final   RBC 07/18/2021 3.90 (L)  4.22 - 5.81 MIL/uL Final   Hemoglobin 07/18/2021 12.5 (L)  13.0 - 17.0 g/dL Final   HCT 07/18/2021 36.0 (L)  39.0 - 52.0 % Final   MCV 07/18/2021 92.3  80.0 - 100.0 fL Final   MCH 07/18/2021 32.1  26.0 - 34.0 pg Final   MCHC 07/18/2021 34.7  30.0 - 36.0 g/dL Final   RDW 07/18/2021 13.0  11.5 - 15.5 % Final    Platelets 07/18/2021 187  150 - 400 K/uL Final   nRBC 07/18/2021 0.0  0.0 - 0.2 % Final   Performed at [facility], [place] [address] [place], [place] [address]   Sodium 07/18/2021 138  135 - 145 mmol/L Final   Potassium 07/18/2021 3.8  3.5 - 5.1 mmol/L Final   Chloride 07/18/2021 103  98 - 111 mmol/L Final   CO2 07/18/2021 27  22 - 32 mmol/L Final   Glucose, Bld 07/18/2021 202 (H)  70 - 99 mg/dL Final   Glucose reference range applies only to samples taken after fasting for at least 8 hours.   BUN 07/18/2021 18  8 - 23 mg/dL Final   Creatinine, Ser 07/18/2021 1.43 (H)  0.61 - 1.24 mg/dL Final   Calcium 07/18/2021 8.7 (L)  8.9 - 10.3 mg/dL Final   GFR, Estimated 07/18/2021 56 (L)  >60 mL/min Final   Comment: (NOTE) Calculated using the CKD-EPI Creatinine Equation (2021)    Anion gap 07/18/2021 8  5 - 15 Final   Performed at [facility], [place] [address] [place], [place] [address]  Orders Only on 07/13/2021  Component Date Value Ref Range Status   SARS Coronavirus 2 07/13/2021 RESULT: NEGATIVE   Final   Comment: RESULT: NEGATIVESARS-CoV-2 INTERPRETATION:A NEGATIVE  test result means that SARS-CoV-2 RNA was not present in the specimen above the limit of detection of this  test. This does not preclude a possible SARS-CoV-2 infection and should not be used as the  sole basis for patient management decisions. Negative results must be combined with clinical observations, patient history, and epidemiological information. Optimum specimen types and timing for peak viral levels during infections caused by SARS-CoV-2  have not been determined. Collection of multiple specimens or types of specimens may be necessary to detect virus. Improper specimen collection and handling, sequence variability under primers/probes, or organism present below the limit of detection may  lead to false negative results. Positive and negative predictive values of testing are highly  dependent on prevalence. False negative test results are more likely when prevalence of disease is high.The expected result is NEGATIVE.Fact S                          heet for  Healthcare Providers: LocalChronicle.no Sheet for Patients: SalonLookup.es Reference Range - Negative   Hospital Outpatient Visit on 07/10/2021  Component Date Value Ref Range Status   WBC 07/10/2021 6.9  4.0 - 10.5 K/uL Final   RBC 07/10/2021 4.64  4.22 - 5.81 MIL/uL Final   Hemoglobin 07/10/2021 14.6  13.0 - 17.0 g/dL Final   HCT 07/10/2021 42.6  39.0 - 52.0 % Final   MCV 07/10/2021 91.8  80.0 - 100.0 fL Final   MCH 07/10/2021 31.5  26.0 - 34.0 pg Final   MCHC 07/10/2021 34.3  30.0 - 36.0 g/dL Final   RDW 07/10/2021 12.9  11.5 - 15.5 % Final   Platelets 07/10/2021 188  150 - 400 K/uL Final   nRBC 07/10/2021 0.0  0.0 - 0.2 % Final   Performed at [facility], [place] [address], [place], [place] [address]   Sodium 07/10/2021 140  135 - 145 mmol/L Final   Potassium 07/10/2021 3.6  3.5 - 5.1 mmol/L Final   Chloride 07/10/2021 105  98 - 111 mmol/L Final   CO2 07/10/2021 26  22 - 32 mmol/L Final   Glucose, Bld 07/10/2021 118 (H)  70 - 99 mg/dL Final   Glucose reference range applies only to samples taken after fasting for at least 8 hours.   BUN 07/10/2021 21  8 - 23 mg/dL Final   Creatinine, Ser 07/10/2021 1.19  0.61 - 1.24 mg/dL Final   Calcium 07/10/2021 9.5  8.9 - 10.3 mg/dL Final   Total Protein 07/10/2021 7.1  6.5 - 8.1 g/dL Final   Albumin 07/10/2021 4.2  3.5 - 5.0 g/dL Final   AST 07/10/2021 40  15 - 41 U/L Final   ALT 07/10/2021 44  0 - 44 U/L Final   Alkaline Phosphatase 07/10/2021 57  38 - 126 U/L Final   Total Bilirubin 07/10/2021 1.3 (H)  0.3 - 1.2 mg/dL Final   GFR, Estimated 07/10/2021 >60  >60 mL/min Final   Comment: (NOTE) Calculated using the CKD-EPI Creatinine Equation (2021)    Anion gap 07/10/2021 9  5 - 15 Final    Performed at [facility], [place] [address] [place], [place] [address]   Prothrombin Time 07/10/2021 14.4  11.4 - 15.2 seconds Final   INR 07/10/2021 1.1  0.8 - 1.2 Final   Comment: (NOTE) INR goal varies based on device and disease states. Performed at [facility], [place] [address], [place], [place] [address]    MRSA, PCR 07/10/2021 NEGATIVE  NEGATIVE Final   Staphylococcus aureus 07/10/2021 NEGATIVE  NEGATIVE Final   Comment: (NOTE)  The Xpert SA Assay (FDA approved for NASAL specimens in patients 40 years of age and older), is one component of a comprehensive surveillance program. It is not intended to diagnose infection nor to guide or monitor treatment. Performed at [facility], [place] [address] [place], [place] [address]      X-Rays:No results found.  EKG: Orders placed or performed during the hospital encounter of 07/10/21   EKG 12 lead per protocol   EKG 12-Lead   EKG 12-Lead   EKG 12 lead per protocol     Hospital Course: Austin Barker is a 60 y.o. who was admitted to [facility]. They were brought to the operating room on 07/17/2021 and underwent Procedure(s): TOTAL KNEE ARTHROPLASTY.  Patient tolerated the procedure well and was later transferred to the recovery room and then to the orthopaedic floor for postoperative care. They were given PO and IV analgesics for pain control following their surgery. They were given 24 hours of postoperative antibiotics of  Anti-infectives (From admission, onward)    Start     Dose/Rate Route Frequency Ordered Stop   07/17/21 1400  ceFAZolin (ANCEF) IVPB 2g/100 mL premix        2 g 200 mL/hr over 30 Minutes Intravenous Every 6 hours 07/17/21 0954 07/17/21 2041   07/17/21 0742  ceFAZolin (ANCEF) 2-4 GM/100ML-% IVPB       Note to Pharmacy: Bridget Hartshorn   : cabinet override      07/17/21 0742 07/17/21 0959   07/17/21 0741  ceFAZolin (ANCEF) 2-4 GM/100ML-%  IVPB       Note to Pharmacy: Bridget Hartshorn   : cabinet override      07/17/21 0741 07/17/21 0959   07/17/21 0600  ceFAZolin (ANCEF) IVPB 3g/100 mL premix  Status:  Discontinued        3 g 200 mL/hr over 30 Minutes Intravenous On call to O.R. 07/17/21 0515 07/17/21 0954      and started on DVT prophylaxis in the form of Xarelto.   PT and OT were ordered for total joint protocol. Discharge planning consulted to help with postop disposition and equipment needs.  Patient had a good night on the evening of surgery. They started to get up OOB with therapy on POD #0. Pt was seen during rounds and was ready to go home pending progress with therapy. He worked with therapy on POD #1 and was meeting his goals. Pt was discharged to home later that day in stable condition.  Diet: Cardiac diet Activity: WBAT Follow-up: in 2 weeks Disposition: Home with OPPT Discharged Condition: stable   Discharge Instructions     Call MD / Call 911   Complete by: As directed    If you experience chest pain or shortness of breath, CALL 911 and be transported to the hospital emergency room.  If you develope a fever above 101 F, pus (white drainage) or increased drainage or redness at the wound, or calf pain, call your surgeon's office.   Change dressing   Complete by: As directed    You may remove the bulky bandage (ACE wrap and gauze) two days after surgery. You will have an adhesive waterproof bandage underneath. Leave this in place until your first follow-up appointment.   Constipation Prevention   Complete by: As directed    Drink plenty of fluids.  Prune juice may be helpful.  You may use a stool softener, such as Colace (over the counter) 100 mg twice a day.  Use MiraLax (over the counter) for constipation as needed.   Diet - low sodium heart healthy   Complete by: As directed    Do not put a pillow under the knee. Place it under the heel.   Complete by: As directed    Driving restrictions   Complete by: As  directed    No driving for two weeks   Post-operative opioid taper instructions:   Complete by: As directed    POST-OPERATIVE OPIOID TAPER INSTRUCTIONS: It is important to wean off of your opioid medication as soon as possible. If you do not need pain medication after your surgery it is ok to stop day one. Opioids include: Codeine, Hydrocodone(Norco, Vicodin), Oxycodone(Percocet, oxycontin) and hydromorphone amongst others.  Long term and even short term use of opiods can cause: Increased pain response Dependence Constipation Depression Respiratory depression And more.  Withdrawal symptoms can include Flu like symptoms Nausea, vomiting And more Techniques to manage these symptoms Hydrate well Eat regular healthy meals Stay active Use relaxation techniques(deep breathing, meditating, yoga) Do Not substitute Alcohol to help with tapering If you have been on opioids for less than two weeks and do not have pain than it is ok to stop all together.  Plan to wean off of opioids This plan should start within one week post op of your joint replacement. Maintain the same interval or time between taking each dose and first decrease the dose.  Cut the total daily intake of opioids by one tablet each day Next start to increase the time between doses. The last dose that should be eliminated is the evening dose.      TED hose   Complete by: As directed    Use stockings (TED hose) for three weeks on both leg(s).  You may remove them at night for sleeping.   Weight bearing as tolerated   Complete by: As directed       Allergies as of 07/18/2021   No Known Allergies      Medication List     STOP taking these medications    Fish Oil 1000 MG Caps       TAKE these medications    acetaminophen 500 MG tablet Commonly known as: TYLENOL Take 1,000 mg by mouth every 6 (six) hours as needed for mild pain or headache.   atorvastatin 40 MG tablet Commonly known as: LIPITOR Take 1  tablet (40 mg total) by mouth daily.   benazepril 40 MG tablet Commonly known as: LOTENSIN Take 40 mg by mouth daily.   cetirizine 10 MG tablet Commonly known as: ZYRTEC Take 10 mg by mouth daily.   fenofibrate 160 MG tablet Take 160 mg by mouth daily.   gabapentin 300 MG capsule Commonly known as: NEURONTIN Take a 300 mg capsule three times a day for two weeks following surgery.Then take a 300 mg capsule two times a day for two weeks. Then take a 300 mg capsule once a day for two weeks. Then discontinue.   hydrochlorothiazide 25 MG tablet Commonly known as: HYDRODIURIL Take 25 mg by mouth daily.   methocarbamol 500 MG tablet Commonly known as: ROBAXIN Take 1 tablet (500 mg total) by mouth every 6 (six) hours as needed for muscle spasms.   omeprazole 20 MG capsule Commonly known as: PRILOSEC Take 20 mg by mouth daily.   oxyCODONE 5 MG immediate release tablet Commonly known as: Oxy IR/ROXICODONE Take 1-2 tablets (5-10 mg total) by mouth every 6 (six) hours as needed for  severe pain.   rivaroxaban 10 MG Tabs tablet Commonly known as: XARELTO Take 1 tablet (10 mg total) by mouth daily with breakfast for 20 days. Then take one 81 mg aspirin once a day for three weeks. Then discontinue aspirin.   traMADol 50 MG tablet Commonly known as: ULTRAM Take 1-2 tablets (50-100 mg total) by mouth every 6 (six) hours as needed for moderate pain.   Visine-A 0.025-0.3 % ophthalmic solution Generic drug: naphazoline-pheniramine Place 1 drop into both eyes daily as needed for eye irritation.               Discharge Care Instructions  (From admission, onward)           Start     Ordered   07/18/21 0000  Weight bearing as tolerated        07/18/21 0727   07/18/21 0000  Change dressing       Comments: You may remove the bulky bandage (ACE wrap and gauze) two days after surgery. You will have an adhesive waterproof bandage underneath. Leave this in place until your first  follow-up appointment.   07/18/21 0727            Follow-up Information     Gaynelle Arabian, MD. Schedule an appointment as soon as possible for a visit in 2 week(s).   Specialty: Orthopedic Surgery Contact information: [address] [place] [address] [id number]                 Signed: Theresa Duty, PA-C Orthopedic Surgery 08/02/2021, 12:38 PM

## 2021-09-06 ENCOUNTER — Encounter: Payer: Self-pay | Admitting: Cardiology

## 2021-09-06 ENCOUNTER — Other Ambulatory Visit: Payer: Self-pay

## 2021-09-06 ENCOUNTER — Ambulatory Visit: Payer: BC Managed Care – PPO | Admitting: Cardiology

## 2021-09-06 VITALS — BP 108/66 | HR 84 | Ht 73.0 in | Wt 297.2 lb

## 2021-09-06 DIAGNOSIS — I4819 Other persistent atrial fibrillation: Secondary | ICD-10-CM

## 2021-09-06 MED ORDER — ATENOLOL 50 MG PO TABS: 50.0000 mg | ORAL_TABLET | Freq: Every day | ORAL | 3 refills | Status: DC

## 2021-09-06 NOTE — Progress Notes (Signed)
Electrophysiology Office Note   Date:  09/06/2021   ID:  Rodrigo, Pezzullo 10-Apr-1961, MRN [id number]  PCP:  Dr Glean Salen  Cardiologist:  Dr Minda Meo Primary Electrophysiologist: Dr Curt Bears   CC: Evaluation of atrial fibrillation   History of Present Illness: Austin Barker is a 61 y.o. male  who is being seen today for the evaluation of paroxsymal atrial fibrillation referred by Dr. Gerda Diss. Marland Kitchen   He has a history significant for obstructive sleep apnea, hypertension, hyperlipidemia, obesity, and atrial fibrillation.  He was seen by his primary care physician and was noted to have severe fatigue and exertional dyspnea.  He was started on amiodarone and Xarelto and underwent cardioversion 06/19/2018 which was successful.  He continued to have paroxysms of atrial fibrillation and had a repeat cardioversion 06/30/2018.  He unfortunately had more frequent episodes of atrial fibrillation and is now status post ablation 09/12/2018.   Today, denies symptoms of palpitations, chest pain, shortness of breath, orthopnea, PND, lower extremity edema, claudication, dizziness, presyncope, syncope, bleeding, or neurologic sequela. The patient is tolerating medications without difficulties.  He has been doing well.  He has no chest pain or shortness of breath.  Is able to do all of his daily activities.  He has had 1 episode of palpitations that lasted approximately 20 minutes since last being seen.  He is overall happy with his control.  He did have a right knee replacement 7 weeks ago and did well with that.  He is ready to get back to exercise.   Past Medical History:  Diagnosis Date   A-fib ([facility])        Dyslipidemia    Essential hypertension, benign    Family history of abdominal aortic aneurysm (AAA)    FH: coronary artery disease    GERD (gastroesophageal reflux disease)    Gout, unspecified    Long term (current) use of anticoagulants    Morbid obesity (HCC)    OSA on  CPAP    Past Surgical History:  Procedure Laterality Date   ATRIAL FIBRILLATION ABLATION N/A 09/12/2018   Procedure: ATRIAL FIBRILLATION ABLATION;  Surgeon: Constance Haw, MD;  Location: [facility];  Service: Cardiovascular;  Laterality: N/A;   CARDIOVERSION  06/30/2018   CYSTOSCOPY WITH HOLMIUM LASER LITHOTRIPSY  2018   x2 with stent   SEPTOPLASTY  1990   TONSILLECTOMY  2015   TOTAL KNEE ARTHROPLASTY Left 07/20/2019   Procedure: TOTAL KNEE ARTHROPLASTY;  Surgeon: Gaynelle Arabian, MD;  Location: [facility];  Service: Orthopedics;  Laterality: Left;  39min   TOTAL KNEE ARTHROPLASTY Right 07/17/2021   Procedure: TOTAL KNEE ARTHROPLASTY;  Surgeon: Gaynelle Arabian, MD;  Location: [facility];  Service: Orthopedics;  Laterality: Right;     Current Outpatient Medications  Medication Sig Dispense Refill   acetaminophen (TYLENOL) 500 MG tablet Take 1,000 mg by mouth every 6 (six) hours as needed for mild pain or headache.     atorvastatin (LIPITOR) 40 MG tablet Take 1 tablet (40 mg total) by mouth daily. 90 tablet 3   benazepril (LOTENSIN) 40 MG tablet Take 40 mg by mouth daily.     cetirizine (ZYRTEC) 10 MG tablet Take 10 mg by mouth daily.     fenofibrate 160 MG tablet Take 160 mg by mouth daily.     hydrochlorothiazide (HYDRODIURIL) 25 MG tablet Take 25 mg by mouth daily.     naphazoline-pheniramine (VISINE-A) 0.025-0.3 % ophthalmic solution Place 1 drop into both eyes daily  as needed for eye irritation.     omeprazole (PRILOSEC) 20 MG capsule Take 20 mg by mouth daily.     atenolol (TENORMIN) 50 MG tablet Take 1 tablet (50 mg total) by mouth daily. 90 tablet 3   gabapentin (NEURONTIN) 300 MG capsule Take a 300 mg capsule three times a day for two weeks following surgery.Then take a 300 mg capsule two times a day for two weeks. Then take a 300 mg capsule once a day for two weeks. Then discontinue. (Patient not taking: Reported on 09/06/2021) 84 capsule 0   methocarbamol (ROBAXIN) 500 MG  tablet Take 1 tablet (500 mg total) by mouth every 6 (six) hours as needed for muscle spasms. (Patient not taking: Reported on 09/06/2021) 40 tablet 0   oxyCODONE (OXY IR/ROXICODONE) 5 MG immediate release tablet Take 1-2 tablets (5-10 mg total) by mouth every 6 (six) hours as needed for severe pain. (Patient not taking: Reported on 09/06/2021) 42 tablet 0   traMADol (ULTRAM) 50 MG tablet Take 1-2 tablets (50-100 mg total) by mouth every 6 (six) hours as needed for moderate pain. (Patient not taking: Reported on 09/06/2021) 40 tablet 0   No current facility-administered medications for this visit.    Allergies:   Patient has no known allergies.   Social History:  The patient  reports that he has never smoked. He has never used smokeless tobacco. He reports that he does not currently use alcohol. He reports that he does not use drugs.   Family History:  The patient's family history includes Diabetes in his father; Heart attack (age of onset: 67) in his brother.   ROS:  Please see the history of present illness.   Otherwise, review of systems is positive for none.   All other systems are reviewed and negative.   PHYSICAL EXAM: VS:  BP 108/66    Pulse 84    Ht 6\' 1"  (1.854 m)    Wt 297 lb 3.2 oz (134.8 kg)    SpO2 97%    BMI 39.21 kg/m  , BMI Body mass index is 39.21 kg/m. GEN: Well nourished, well developed, in no acute distress  HEENT: normal  Neck: no JVD, carotid bruits, or masses Cardiac: RRR; no murmurs, rubs, or gallops,no edema  Respiratory:  clear to auscultation bilaterally, normal work of breathing GI: soft, nontender, nondistended, + BS MS: no deformity or atrophy  Skin: warm and dry Neuro:  Strength and sensation are intact Psych: euthymic mood, full affect  EKG:  EKG is not ordered today. Personal review of the ekg ordered 07/10/21 shows sinus rhythm with intermittent junctional rhythm, rate 66   Recent Labs: 07/10/2021: ALT 44 07/18/2021: BUN 18; Creatinine, Ser 1.43;  Hemoglobin 12.5; Platelets 187; Potassium 3.8; Sodium 138    Lipid Panel     Component Value Date/Time   CHOL 159 10/10/2018 0729   TRIG 248 (H) 10/10/2018 0729   HDL 30 (L) 10/10/2018 0729   CHOLHDL 5.3 (H) 10/10/2018 0729   LDLCALC 79 10/10/2018 0729     Wt Readings from Last 3 Encounters:  09/06/21 297 lb 3.2 oz (134.8 kg)  07/17/21 292 lb 5.3 oz (132.6 kg)  07/10/21 292 lb 6 oz (132.6 kg)      Other studies Reviewed: Additional studies/ records that were reviewed today include: [facility] notes, D[facility]V report 06/30/18. Echo 06/16/18 Review of the above records today demonstrates:  Echo-LVEF 55-60%, mild LVH, mild MR, mild aortic root dilation (57mm), LA normal size.  ASSESSMENT AND PLAN:  1.  Persistent atrial fibrillation: CHA2DS2-VASc of 1 and thus not anticoagulated.  Status post ablation 09/12/2018. Has remained in sinus rhythm and feeling well. Traevon Meiring continue current management.   2.  Obstructive sleep apnea: CPAP compliance reported  3.  Hypertension:currently well controlled  4.  Morbid obesity: Body mass index is 39.21 kg/m. Yurianna Tusing work on exercise now that he has knee replacements   Current medicines are reviewed at length with the patient today.   The patient does not have concerns regarding his medicines.  The following changes were made today: none  Labs/ tests ordered today include: No orders of the defined types were placed in this encounter.    Disposition:   Follow-up with Anaise Sterbenz 12 months  Signed, Harriett Azar Meredith Leeds, MD  09/06/2021 3:44 PM     [place] [address] [place] [address] [phone] (office) [phone] (fax)

## 2022-10-02 ENCOUNTER — Other Ambulatory Visit: Payer: Self-pay | Admitting: Cardiology

## 2022-12-27 ENCOUNTER — Other Ambulatory Visit: Payer: Self-pay | Admitting: Cardiology

## 2022-12-27 ENCOUNTER — Telehealth: Payer: Self-pay | Admitting: Cardiology

## 2022-12-27 ENCOUNTER — Other Ambulatory Visit: Payer: Self-pay

## 2022-12-27 MED ORDER — ATENOLOL 50 MG PO TABS: ORAL_TABLET | ORAL | 0 refills | Status: DC

## 2022-12-27 NOTE — Telephone Encounter (Signed)
*  STAT* If patient is at the pharmacy, call can be transferred to refill team.   1. Which medications need to be refilled? (please list name of each medication and dose if known) atenolol (TENORMIN) 50 MG tablet   2. Which pharmacy/location (including street and city if local pharmacy) is medication to be sent to? [id number] - [place], [place] - [address]   3. Do they need a 30 day or 90 day supply? 90.   Patient has appt 5/24

## 2023-01-22 ENCOUNTER — Other Ambulatory Visit: Payer: Self-pay | Admitting: Cardiology

## 2023-01-23 NOTE — Progress Notes (Signed)
Cardiology Office Note Date:  01/25/2023  Patient ID:  Austin Barker [birth year], MRN [id number] PCP:  Mariane Duval, MD  Cardiologist: Dr Georgana Curio  Electrophysiologist: Dr. Elberta Fortis     Chief Complaint:  6 mo  History of Present Illness: Austin Barker is a 62 y.o. male with history of obesity, OSA w/CPAP, HTN, HLD, AFib  He saw Dr. Elberta Fortis 09/06/21, reported an episode of about w/palpitations since his prior visit , happy with his rhythm management, was pending knee surgery. Not on OAC post ablation with low risk score  TODAY He feels quite well Recently moved, a lot of packing/carrying boxes on the and and again here with good exertional capacity. His wife recently found with breast cancer s/p mastectomy > reconstruction in process, they are expecting their 1st grand baby next week  No CP, palpitations or SOB No near syncope or syncope.  He does not think he has had any Afib in years. Last would have been about 3 years ago, maybe 3 minutes, did some deep breaths and resolved   Device information Diagnosed 2019 Amiodarone started 2019 >> stopped 11/20/2018 with elevation in LFTs PVI ablation 09/12/2018   Past Medical History:  Diagnosis Date   A-fib [facility])    ablation   Coronary artery disease    Dyslipidemia    Essential hypertension, benign    GERD (gastroesophageal reflux disease)    Gout, unspecified    History of kidney stones 2018   Morbid obesity (HCC)    OSA on CPAP     Past Surgical History:  Procedure Laterality Date   ATRIAL FIBRILLATION ABLATION N/A 09/12/2018   Procedure: ATRIAL FIBRILLATION ABLATION;  Surgeon: Regan Lemming, MD;  [facility]: [facility] IN[facility];  Service: Cardiovascular;  Laterality: N/A;   CARDIOVERSION  06/30/2018   CYSTOSCOPY WITH HOLMIUM LASER LITHOTRIPSY  2018   x2 with stent   SEPTOPLASTY  1990   TONSILLECTOMY  2015   TOTAL KNEE ARTHROPLASTY Left 07/20/2019   Procedure: TOTAL KNEE  ARTHROPLASTY;  Surgeon: Ollen Gross, MD;  Location: [facility] OR[facility];  Service: Orthopedics;  Laterality: Left;    TOTAL KNEE ARTHROPLASTY Right 07/17/2021   Procedure: TOTAL KNEE ARTHROPLASTY;  Surgeon: Ollen Gross, MD;  Location: [facility] OR[facility];  Service: Orthopedics;  Laterality: Right;    Current Outpatient Medications  Medication Sig Dispense Refill   acetaminophen (TYLENOL) 500 MG tablet Take 1,000 mg by mouth every 6 (six) hours as needed for mild pain or headache.     atenolol (TENORMIN) 50 MG tablet TAKE 1 TABLET(50 MG) BY MOUTH DAILY 30 tablet 0   atorvastatin (LIPITOR) 40 MG tablet Take 1 tablet (40 mg total) by mouth daily. 90 tablet 3   benazepril (LOTENSIN) 40 MG tablet Take 40 mg by mouth daily.     cetirizine (ZYRTEC) 10 MG tablet Take 10 mg by mouth daily.     fenofibrate 160 MG tablet Take 160 mg by mouth daily.     methocarbamol (ROBAXIN) 500 MG tablet Take 1 tablet (500 mg total) by mouth every 6 (six) hours as needed for muscle spasms. 40 tablet 0   naphazoline-pheniramine (VISINE-A) 0.025-0.3 % ophthalmic solution Place 1 drop into both eyes daily as needed for eye irritation.     omeprazole (PRILOSEC) 20 MG capsule Take 20 mg by mouth daily.     No current facility-administered medications for this visit.    Allergies:   Patient has no known allergies.   Social  History:  The patient  reports that he has never smoked. He has never used smokeless tobacco. He reports that he does not currently use alcohol. He reports that he does not use drugs.   Family History:  The patient's family history includes Diabetes in his father; Heart attack (age of onset: 67) in his brother.  ROS:  Please see the history of present illness.    All other systems are reviewed and otherwise negative.   PHYSICAL EXAM:  VS:  BP 132/84   Pulse 78   Ht 6\' 1"  (1.854 m)   Wt 294 lb (133.4 kg)   SpO2 96%   BMI 38.79 kg/m  BMI: Body mass index is 38.79 kg/m. Well nourished, well developed, in no  acute distress HEENT: normocephalic, atraumatic Neck: no JVD, carotid bruits or masses Cardiac:  RRR; no significant murmurs, no rubs, or gallops Lungs:  CTA b/l, no wheezing, rhonchi or rales Abd: soft, nontender MS: no deformity or atrophy Ext: no edema Skin: warm and dry, no rash Neuro:  No gross deficits appreciated Psych: euthymic mood, full affect   EKG:  Done today and reviewed by myself shows  SR/sinus arrhythmias 78bpm, no acute changes   09/30/2018: Stress myoview Nuclear stress EF: 61%. The left ventricular ejection fraction is normal (55-65%). There was no ST segment deviation noted during stress. This is a low risk study. There is no evidence of ischemia and no evidence of previous infarction The study is normal.     09/12/2018: EPS/ablation CONCLUSIONS: 1. Sinus rhythm upon presentation.   2. Successful electrical isolation and anatomical encircling of all four pulmonary veins with radiofrequency current. 3. No inducible arrhythmias following ablation both on and off of dobutamine 4. No early apparent complications.   06/16/2018: TTE LVEF 55-60%  Recent Labs: No results found for requested labs within last 365 days.  No results found for requested labs within last 365 days.   CrCl cannot be calculated (Patient's most recent lab result is older than the maximum 21 days allowed.).   Wt Readings from Last 3 Encounters:  01/25/23 294 lb (133.4 kg)  09/06/21 297 lb 3.2 oz (134.8 kg)  07/17/21 292 lb 5.3 oz (132.6 kg)     Other studies reviewed: Additional studies/records reviewed today include: summarized above  ASSESSMENT AND PLAN:  Paroxysmal AFib CHA2DS2Vasc is one, off a/c No symptoms  HTN Could be better Discussed exercise, weight loss and continued f/u with his PMD   Disposition: F/u with [place] again in a year, sooner if needed  Current medicines are reviewed at length with the patient today.  The patient did not have any concerns regarding  medicines.  Norma Fredrickson, PA-C 01/25/2023 8:54 AM     [facility] [address] [place] [address] [phone] (office)  [phone] (fax)

## 2023-01-25 ENCOUNTER — Ambulatory Visit: Payer: BC Managed Care – PPO | Attending: Physician Assistant | Admitting: Physician Assistant

## 2023-01-25 ENCOUNTER — Encounter: Payer: Self-pay | Admitting: Physician Assistant

## 2023-01-25 VITALS — BP 132/84 | HR 78 | Ht 73.0 in | Wt 294.0 lb

## 2023-01-25 DIAGNOSIS — I10 Essential (primary) hypertension: Secondary | ICD-10-CM

## 2023-01-25 DIAGNOSIS — I480 Paroxysmal atrial fibrillation: Secondary | ICD-10-CM

## 2023-01-25 MED ORDER — ATENOLOL 50 MG PO TABS: ORAL_TABLET | ORAL | 3 refills | Status: DC

## 2023-01-25 NOTE — Patient Instructions (Signed)
Medication Instructions:   Your physician recommends that you continue on your current medications as directed. Please refer to the Current Medication list given to you today.   *If you need a refill on your cardiac medications before your next appointment, please call your pharmacy*   Lab Work: NONE ORDERED  TODAY    If you have labs (blood work) drawn today and your tests are completely normal, you will receive your results only by: MyChart Message (if you have MyChart) OR A paper copy in the mail If you have any lab test that is abnormal or we need to change your treatment, we will call you to review the results.   Testing/Procedures: NONE ORDERED  TODAY     Follow-Up: At [facility], you and your health needs are our priority.  As part of our continuing mission to provide you with exceptional heart care, we have created designated Provider Care Teams.  These Care Teams include your primary Cardiologist (physician) and Advanced Practice Providers (APPs -  Physician Assistants and Nurse Practitioners) who all work together to provide you with the care you need, when you need it.  We recommend signing up for the patient portal called "MyChart".  Sign up information is provided on this After Visit Summary.  MyChart is used to connect with patients for Virtual Visits (Telemedicine).  Patients are able to view lab/test results, encounter notes, upcoming appointments, etc.  Non-urgent messages can be sent to your provider as well.   To learn more about what you can do with MyChart, go to [email or web address].    Your next appointment:   1 year(s)  Provider:   You may see Will Martin Camnitz, MD or one of the following Advanced Practice Providers on your designated Care Team:   Renee Ursuy, PA-C  Other Instructions  

## 2023-05-03 DIAGNOSIS — Z96653 Presence of artificial knee joint, bilateral: Secondary | ICD-10-CM | POA: Diagnosis not present

## 2023-08-12 ENCOUNTER — Telehealth: Payer: Self-pay | Admitting: Cardiology

## 2023-08-12 NOTE — Telephone Encounter (Signed)
Spoke with patient and he states he has been in AFIB for the last couple of days. He states he wanted to let you know just incase you would like to make adjustments to his mediations  No chest pain or SOB. Has been a little fatigue.  Advised to monitor heart rate for now and stay hydrated. ED precautions discussed. Will forward to provider

## 2023-08-12 NOTE — Telephone Encounter (Signed)
Pt called in stating he is back in afib and experiencing some fatigue. He denies any SOB or CP. Please advise what he should do.

## 2023-08-12 NOTE — Telephone Encounter (Signed)
Pt reports noticing afib recently/since last week. Believes it is coming and going, but unsure how long it is lasting. He was feeling tired lately, similar to how he felt w/ afib prior to ablation. Apple watch HRs 95-110s at rest. Pt does report he is under a lot of stress recently (wife had surgery for breast CA last Monday.  Dtr is going through a divorce) Pt aware I will forward to AFib clinic to see if they can work him in this week...Marland KitchenMarland Kitchen aware they will call him to arrange an appt to determine plan of care and if [facility] needs to be restarted. Patient verbalized understanding and agreeable to plan.

## 2023-08-14 ENCOUNTER — Encounter (HOSPITAL_COMMUNITY): Payer: Self-pay | Admitting: Internal Medicine

## 2023-08-14 ENCOUNTER — Ambulatory Visit (HOSPITAL_COMMUNITY)
Admission: RE | Admit: 2023-08-14 | Discharge: 2023-08-14 | Disposition: A | Discharge status: Home / Self Care | Payer: BC Managed Care – PPO | Source: Ambulatory Visit | Attending: Internal Medicine | Admitting: Internal Medicine

## 2023-08-14 VITALS — BP 112/70 | HR 107 | Ht 73.0 in | Wt 310.2 lb

## 2023-08-14 DIAGNOSIS — Z6841 Body Mass Index (BMI) 40.0 and over, adult: Secondary | ICD-10-CM | POA: Insufficient documentation

## 2023-08-14 DIAGNOSIS — Z7901 Long term (current) use of anticoagulants: Secondary | ICD-10-CM | POA: Diagnosis not present

## 2023-08-14 DIAGNOSIS — I2510 Atherosclerotic heart disease of native coronary artery without angina pectoris: Secondary | ICD-10-CM | POA: Diagnosis not present

## 2023-08-14 DIAGNOSIS — G4733 Obstructive sleep apnea (adult) (pediatric): Secondary | ICD-10-CM | POA: Insufficient documentation

## 2023-08-14 DIAGNOSIS — I480 Paroxysmal atrial fibrillation: Secondary | ICD-10-CM | POA: Diagnosis not present

## 2023-08-14 DIAGNOSIS — I10 Essential (primary) hypertension: Secondary | ICD-10-CM | POA: Diagnosis not present

## 2023-08-14 DIAGNOSIS — D6869 Other thrombophilia: Secondary | ICD-10-CM | POA: Diagnosis not present

## 2023-08-14 DIAGNOSIS — Z79899 Other long term (current) drug therapy: Secondary | ICD-10-CM | POA: Insufficient documentation

## 2023-08-14 DIAGNOSIS — E785 Hyperlipidemia, unspecified: Secondary | ICD-10-CM | POA: Diagnosis not present

## 2023-08-14 DIAGNOSIS — I4819 Other persistent atrial fibrillation: Secondary | ICD-10-CM | POA: Insufficient documentation

## 2023-08-14 MED ORDER — RIVAROXABAN 20 MG PO TABS: 20.0000 mg | ORAL_TABLET | Freq: Every day | ORAL | 4 refills | Status: DC

## 2023-08-14 MED ORDER — DILTIAZEM HCL 30 MG PO TABS: 30.0000 mg | ORAL_TABLET | Freq: Two times a day (BID) | ORAL | 3 refills | Status: DC

## 2023-08-14 NOTE — Progress Notes (Signed)
Primary Care Physician: Mariane Duval, MD Primary Cardiologist: Dr Georgana Curio Primary Electrophysiologist: Dr Elberta Fortis Referring Physician: Dr Claris Che Tshombe Doolin is a 61 y.o. male with a history of paroxysmal atrial fibrillation, CAD (CAC score of 636 in 2020), OSA, HTN, HLD who presents for consultation in the [facility].  The patient was initially diagnosed with atrial fibrillation 05/2018 at his PCP after presenting with symptoms of fatigue and exertional dyspnea. S/p DCCV on 06/19/18 and 06/30/18 with early return of afib. Now s/p afib ablation by Dr Elberta Fortis 09/12/18.   On evaluation today, he is currently in Afib. He contacted office on 12/9 noting he has been in Afib for the past couple of days. He notes acute stress due to wife having third surgery related to breast cancer and daughter going through divorce. He notes his HR was in the 130s this morning. He wears an Apple watch. Patient is not on anticoagulation.  Today, he denies symptoms of palpitations, chest pain, shortness of breath, orthopnea, PND, lower extremity edema, dizziness, presyncope, syncope, snoring, daytime somnolence, bleeding, or neurologic sequela. The patient is tolerating medications without difficulties and is otherwise without complaint today.    Atrial Fibrillation Risk Factors:  he does have symptoms or diagnosis of sleep apnea. he is compliant with CPAP therapy. he does not have a history of rheumatic fever. he does not have a history of alcohol use. he has a BMI of Body mass index is 40.93 kg/m.Marland Kitchen Filed Weights   08/14/23 1035  Weight: (!) 140.7 kg   Family History  Problem Relation Age of Onset   Diabetes Father    Heart attack Brother 78   The patient does not have a history of early familial atrial fibrillation or other arrhythmias.   Atrial Fibrillation Management history:  Previous antiarrhythmic drugs: amiodarone Previous cardioversions: 06/19/18,  06/30/18 Previous ablations: 09/12/18 CHADS2VASC score: 2 Anticoagulation history: Xarelto   Past Medical History:  Diagnosis Date   A-fib [facility])    ablation   Coronary artery disease    Dyslipidemia    Essential hypertension, benign    GERD (gastroesophageal reflux disease)    Gout, unspecified    History of kidney stones 2018   Morbid obesity (HCC)    OSA on CPAP    Past Surgical History:  Procedure Laterality Date   ATRIAL FIBRILLATION ABLATION N/A 09/12/2018   Procedure: ATRIAL FIBRILLATION ABLATION;  Surgeon: Regan Lemming, MD;  [facility]: [facility];  Service: Cardiovascular;  Laterality: N/A;   CARDIOVERSION  06/30/2018   CYSTOSCOPY WITH HOLMIUM LASER LITHOTRIPSY  2018   x2 with stent   SEPTOPLASTY  1990   TONSILLECTOMY  2015   TOTAL KNEE ARTHROPLASTY Left 07/20/2019   Procedure: TOTAL KNEE ARTHROPLASTY;  Surgeon: Ollen Gross, MD;  Location: [facility];  Service: Orthopedics;  Laterality: Left;    TOTAL KNEE ARTHROPLASTY Right 07/17/2021   Procedure: TOTAL KNEE ARTHROPLASTY;  Surgeon: Ollen Gross, MD;  Location: [facility];  Service: Orthopedics;  Laterality: Right;    Current Outpatient Medications  Medication Sig Dispense Refill   acetaminophen (TYLENOL) 500 MG tablet Take 1,000 mg by mouth every 6 (six) hours as needed for mild pain or headache.     atenolol (TENORMIN) 50 MG tablet TAKE 1 TABLET(50 MG) BY MOUTH DAILY 90 tablet 3   atorvastatin (LIPITOR) 40 MG tablet Take 1 tablet (40 mg total) by mouth daily. 90 tablet 3   benazepril (LOTENSIN) 40 MG  tablet Take 20 mg by mouth daily.     cetirizine (ZYRTEC) 10 MG tablet Take 10 mg by mouth daily.     diltiazem (CARDIZEM) 30 MG tablet Take 1 tablet (30 mg total) by mouth 2 (two) times daily. 60 tablet 3   fenofibrate 160 MG tablet Take 160 mg by mouth daily.     hydrochlorothiazide (HYDRODIURIL) 25 MG tablet Take 12.5 mg by mouth daily.     Omega-3 Fatty Acids (FISH OIL) 1000 MG CAPS Take 1 capsule  by mouth daily.     omeprazole (PRILOSEC) 20 MG capsule Take 20 mg by mouth daily.     rivaroxaban (XARELTO) 20 MG TABS tablet Take 1 tablet (20 mg total) by mouth daily with supper. 30 tablet 4   methocarbamol (ROBAXIN) 500 MG tablet Take 1 tablet (500 mg total) by mouth every 6 (six) hours as needed for muscle spasms. (Patient not taking: Reported on 08/14/2023) 40 tablet 0   naphazoline-pheniramine (VISINE-A) 0.025-0.3 % ophthalmic solution Place 1 drop into both eyes daily as needed for eye irritation.     No current facility-administered medications for this encounter.    No Known Allergies  ROS- All systems are reviewed and negative except as per the HPI above.  Physical Exam: Vitals:   08/14/23 1035  BP: 112/70  Pulse: (!) 107  Weight: (!) 140.7 kg  Height: 6\' 1"  (1.854 m)    GEN- The patient is well appearing, alert and oriented x 3 today.   Neck - no JVD or carotid bruit noted Lungs- Clear to ausculation bilaterally, normal work of breathing Heart- Tachycardic irregular rate and rhythm, no murmurs, rubs or gallops, PMI not laterally displaced Extremities- no clubbing, cyanosis, or edema Skin - no rash or ecchymosis noted   Wt Readings from Last 3 Encounters:  08/14/23 (!) 140.7 kg  01/25/23 133.4 kg  09/06/21 134.8 kg    EKG today demonstrates  Vent. rate 107 BPM PR interval * ms QRS duration 76 ms QT/QTcB 318/424 ms P-R-T axes * 49 20 Atrial fibrillation with rapid ventricular response Abnormal ECG When compared with ECG of 10-Jul-2021 12:34, PREVIOUS ECG IS PRESENT  Echo 06/16/18 demonstrated LVEF 55-60%, mild LVH, mild MR, mild aortic root dilation (43mm), LA normal size.  Epic records are reviewed at length today  CHA2DS2-VASc Score = 2  The patient's score is based upon: CHF History: 0 HTN History: 1 Diabetes History: 0 Stroke History: 0 Vascular Disease History: 1 Age Score: 0 Gender Score: 0       Assessment and Plan:  1. Paroxysmal  atrial fibrillation S/p afib ablation by Dr Elberta Fortis 09/12/18.   He is currently in Afib. We discussed options for returning to normal rhythm. He appears to have overall low burden of Afib (wears Apple watch) with new episode likely secondary to acute stress.  We will do the following in preparation for possible cardioversion to help patient convert to normal rhythm: -Stop ASA -Begin Xarelto 20 mg daily (was on this previously) -Reduce benazepril to half tablet (20 mg) daily -Begin diltiazem 30 mg BID. Monitor BP   2. Secondary hypercoagulable state due to atrial fibrillation  I did discuss with patient he meets criteria for anticoagulation according to current guidelines with CAC score of 636 and hypertension. We went over compliance with Xarelto 20 mg daily and also briefly discussed the Watchman procedure. He will begin Xarelto 20 mg daily later today.   3. HTN Stable, no changes today.  4. Obstructive sleep  apnea He uses his CPAP nightly.   5. Obesity Body mass index is 40.93 kg/m. He has joined a gym to try and lose weight.   Follow up 2-3 weeks.   Justin Mend, PA-C Afib Clinic [facility] [address] [place], [place] [address] [phone]

## 2023-08-14 NOTE — Patient Instructions (Addendum)
Stop aspirin  Start Xarelto 20mg  once a day with supper  Decrease benazepril to 1/2 tablet daily  Start Cardizem (Diltiazem) 30mg  twice a day

## 2023-08-30 ENCOUNTER — Ambulatory Visit (HOSPITAL_COMMUNITY)
Admission: RE | Admit: 2023-08-30 | Discharge: 2023-08-30 | Disposition: A | Discharge status: Home / Self Care | Payer: BC Managed Care – PPO | Source: Ambulatory Visit | Attending: Internal Medicine | Admitting: Internal Medicine

## 2023-08-30 ENCOUNTER — Encounter (HOSPITAL_COMMUNITY): Payer: Self-pay | Admitting: Internal Medicine

## 2023-08-30 VITALS — BP 116/80 | HR 101 | Ht 73.0 in | Wt 316.8 lb

## 2023-08-30 DIAGNOSIS — I4819 Other persistent atrial fibrillation: Secondary | ICD-10-CM | POA: Insufficient documentation

## 2023-08-30 DIAGNOSIS — I2510 Atherosclerotic heart disease of native coronary artery without angina pectoris: Secondary | ICD-10-CM | POA: Insufficient documentation

## 2023-08-30 DIAGNOSIS — Z6841 Body Mass Index (BMI) 40.0 and over, adult: Secondary | ICD-10-CM | POA: Insufficient documentation

## 2023-08-30 DIAGNOSIS — D6869 Other thrombophilia: Secondary | ICD-10-CM | POA: Diagnosis not present

## 2023-08-30 DIAGNOSIS — E785 Hyperlipidemia, unspecified: Secondary | ICD-10-CM | POA: Insufficient documentation

## 2023-08-30 DIAGNOSIS — I10 Essential (primary) hypertension: Secondary | ICD-10-CM | POA: Insufficient documentation

## 2023-08-30 DIAGNOSIS — I480 Paroxysmal atrial fibrillation: Secondary | ICD-10-CM | POA: Diagnosis not present

## 2023-08-30 DIAGNOSIS — E669 Obesity, unspecified: Secondary | ICD-10-CM | POA: Insufficient documentation

## 2023-08-30 DIAGNOSIS — G4733 Obstructive sleep apnea (adult) (pediatric): Secondary | ICD-10-CM | POA: Diagnosis not present

## 2023-08-30 DIAGNOSIS — Z7901 Long term (current) use of anticoagulants: Secondary | ICD-10-CM | POA: Insufficient documentation

## 2023-08-30 LAB — CBC
HCT: 44.1 % (ref 39.0–52.0)
Hemoglobin: 15 g/dL (ref 13.0–17.0)
MCH: 31.1 pg (ref 26.0–34.0)
MCHC: 34 g/dL (ref 30.0–36.0)
MCV: 91.5 fL (ref 80.0–100.0)
Platelets: 235 10*3/uL (ref 150–400)
RBC: 4.82 MIL/uL (ref 4.22–5.81)
RDW: 13.1 % (ref 11.5–15.5)
WBC: 8.3 10*3/uL (ref 4.0–10.5)
nRBC: 0 % (ref 0.0–0.2)

## 2023-08-30 LAB — BASIC METABOLIC PANEL
Anion gap: 10 (ref 5–15)
BUN: 15 mg/dL (ref 8–23)
CO2: 23 mmol/L (ref 22–32)
Calcium: 8.9 mg/dL (ref 8.9–10.3)
Chloride: 105 mmol/L (ref 98–111)
Creatinine, Ser: 1.36 mg/dL — ABNORMAL HIGH (ref 0.61–1.24)
GFR, Estimated: 58 mL/min — ABNORMAL LOW (ref >60)
Glucose, Bld: 214 mg/dL — ABNORMAL HIGH (ref 70–99)
Potassium: 3.5 mmol/L (ref 3.5–5.1)
Sodium: 138 mmol/L (ref 135–145)

## 2023-08-30 NOTE — Patient Instructions (Addendum)
Cardioversion scheduled for: Friday, January 17th   - Arrive at the [facility] and go to admitting at [facility] not eat or drink anything after midnight the night prior to your procedure.   - Take all your morning medication (except diabetic medications) with a sip of water prior to arrival.  - You will not be able to drive home after your procedure.    - Do NOT miss any doses of your blood thinner - if you should miss a dose please notify our office immediately.   - If you feel as if you go back into normal rhythm prior to scheduled cardioversion, please notify our office immediately.   If your procedure is canceled in the cardioversion suite you will be charged a cancellation fee.

## 2023-08-30 NOTE — Progress Notes (Addendum)
Primary Care Physician: Mariane Duval, MD Primary Cardiologist: Dr Georgana Curio Primary Electrophysiologist: Dr Elberta Fortis Referring Physician: Dr Claris Che Austin Barker is a 62 y.o. male with a history of persistent atrial fibrillation, CAD (CAC score of 636 in 2020), OSA, HTN, HLD who presents for consultation in the [facility].  The patient was initially diagnosed with atrial fibrillation 05/2018 at his PCP after presenting with symptoms of fatigue and exertional dyspnea. S/p DCCV on 06/19/18 and 06/30/18 with early return of afib. Now s/p afib ablation by Dr Elberta Fortis 09/12/18.   On evaluation today, he is currently in Afib. He contacted office on 12/9 noting he has been in Afib for the past couple of days. He notes acute stress due to wife having third surgery related to breast cancer and daughter going through divorce. He notes his HR was in the 130s this morning. He wears an Apple watch. Patient is not on anticoagulation.  On follow up 08/30/23, he is currently in Afib. He is on Xarelto 20 mg since last OV and missed a dose on 12/21. He feels tired and SOB when in Afib.   Today, he denies symptoms of palpitations, chest pain, shortness of breath, orthopnea, PND, lower extremity edema, dizziness, presyncope, syncope, snoring, daytime somnolence, bleeding, or neurologic sequela. The patient is tolerating medications without difficulties and is otherwise without complaint today.    Atrial Fibrillation Risk Factors:  he does have symptoms or diagnosis of sleep apnea. he is compliant with CPAP therapy. he does not have a history of rheumatic fever. he does not have a history of alcohol use. he has a BMI of Body mass index is 41.8 kg/m.Marland Kitchen Filed Weights   08/30/23 1005  Weight: (!) 143.7 kg    Family History  Problem Relation Age of Onset   Diabetes Father    Heart attack Brother 61   The patient does not have a history of early familial atrial  fibrillation or other arrhythmias.   Atrial Fibrillation Management history:  Previous antiarrhythmic drugs: amiodarone Previous cardioversions: 06/19/18, 06/30/18 Previous ablations: 09/12/18 CHADS2VASC score: 2 Anticoagulation history: Xarelto   Past Medical History:  Diagnosis Date   A-fib [facility])    ablation   Coronary artery disease    Dyslipidemia    Essential hypertension, benign    GERD (gastroesophageal reflux disease)    Gout, unspecified    History of kidney stones 2018   Morbid obesity (HCC)    OSA on CPAP    Past Surgical History:  Procedure Laterality Date   ATRIAL FIBRILLATION ABLATION N/A 09/12/2018   Procedure: ATRIAL FIBRILLATION ABLATION;  Surgeon: Regan Lemming, MD;  Location: [facility] INVASIVE CV LAB;  Service: Cardiovascular;  Laterality: N/A;   CARDIOVERSION  06/30/2018   CYSTOSCOPY WITH HOLMIUM LASER LITHOTRIPSY  2018   x2 with stent   SEPTOPLASTY  1990   TONSILLECTOMY  2015   TOTAL KNEE ARTHROPLASTY Left 07/20/2019   Procedure: TOTAL KNEE ARTHROPLASTY;  Surgeon: Ollen Gross, MD;  Location: [facility] ORS;  Service: Orthopedics;  Laterality: Left;    TOTAL KNEE ARTHROPLASTY Right 07/17/2021   Procedure: TOTAL KNEE ARTHROPLASTY;  Surgeon: Ollen Gross, MD;  Location: [facility] OR[facility];  Service: Orthopedics;  Laterality: Right;    Current Outpatient Medications  Medication Sig Dispense Refill   acetaminophen (TYLENOL) 500 MG tablet Take 1,000 mg by mouth every 6 (six) hours as needed for mild pain or headache.     atenolol (TENORMIN) 50 MG tablet  TAKE 1 TABLET(50 MG) BY MOUTH DAILY 90 tablet 3   atorvastatin (LIPITOR) 40 MG tablet Take 1 tablet (40 mg total) by mouth daily. 90 tablet 3   benazepril (LOTENSIN) 40 MG tablet Take 20 mg by mouth daily.     cetirizine (ZYRTEC) 10 MG tablet Take 10 mg by mouth daily.     diltiazem (CARDIZEM) 30 MG tablet Take 1 tablet (30 mg total) by mouth 2 (two) times daily. 60 tablet 3   fenofibrate 160 MG tablet Take 160 mg  by mouth daily.     hydrochlorothiazide (HYDRODIURIL) 25 MG tablet Take 12.5 mg by mouth daily.     methocarbamol (ROBAXIN) 500 MG tablet Take 1 tablet (500 mg total) by mouth every 6 (six) hours as needed for muscle spasms. 40 tablet 0   naphazoline-pheniramine (VISINE-A) 0.025-0.3 % ophthalmic solution Place 1 drop into both eyes daily as needed for eye irritation.     Omega-3 Fatty Acids (FISH OIL) 1000 MG CAPS Take 1 capsule by mouth daily.     omeprazole (PRILOSEC) 20 MG capsule Take 20 mg by mouth daily.     rivaroxaban (XARELTO) 20 MG TABS tablet Take 1 tablet (20 mg total) by mouth daily with supper. 30 tablet 4   No current facility-administered medications for this encounter.    No Known Allergies  ROS- All systems are reviewed and negative except as per the HPI above.  Physical Exam: Vitals:   08/30/23 1005  BP: 116/80  Pulse: (!) 101  Weight: (!) 143.7 kg  Height: 6\' 1"  (1.854 m)    GEN- The patient is well appearing, alert and oriented x 3 today.   Neck - no JVD or carotid bruit noted Lungs- Clear to ausculation bilaterally, normal work of breathing Heart- Irregular rate and rhythm, no murmurs, rubs or gallops, PMI not laterally displaced Extremities- no clubbing, cyanosis, or edema Skin - no rash or ecchymosis noted   Wt Readings from Last 3 Encounters:  08/30/23 (!) 143.7 kg  08/14/23 (!) 140.7 kg  01/25/23 133.4 kg    EKG today demonstrates  Vent. rate 101 BPM PR interval * ms QRS duration 76 ms QT/QTcB 330/427 ms P-R-T axes * 48 8 Atrial fibrillation with rapid ventricular response Abnormal ECG When compared with ECG of 14-Aug-2023 10:41, PREVIOUS ECG IS PRESENT  Echo 06/16/18 demonstrated LVEF 55-60%, mild LVH, mild MR, mild aortic root dilation (43mm), LA normal size.  Epic records are reviewed at length today  CHA2DS2-VASc Score = 2  The patient's score is based upon: CHF History: 0 HTN History: 1 Diabetes History: 0 Stroke History:  0 Vascular Disease History: 1 Age Score: 0 Gender Score: 0      Assessment and Plan:  1. Persistent atrial fibrillation S/p afib ablation by Dr Elberta Fortis 09/12/18.   He is currently in Afib. We discussed options for returning to normal rhythm. He appears to have overall low burden of Afib (wears Apple watch) with new episode likely secondary to acute stress. He would like to proceed with cardioversion in order to try and convert to NSR. Labs drawn today. He missed a dose of Xarelto on 12/21 and emphasis on adherence mentioned.   Informed Consent   Shared Decision Making/Informed Consent The risks (stroke, cardiac arrhythmias rarely resulting in the need for a temporary or permanent pacemaker, skin irritation or burns and complications associated with conscious sedation including aspiration, arrhythmia, respiratory failure and death), benefits (restoration of normal sinus rhythm) and alternatives of a direct  current cardioversion were explained in detail to Austin Barker and he agrees to proceed.      Continue diltiazem 30 mg BID.   2. Secondary hypercoagulable state due to atrial fibrillation  Continue Xarelto 20 mg daily without interruption.   3. HTN Stable, no changes today.  4. Obstructive sleep apnea He uses his CPAP nightly.   5. Obesity Body mass index is 41.8 kg/m. He has joined a gym to try and lose weight.   Follow up 2 weeks after DCCV.   Justin Mend, PA-C Afib Clinic [facility] [address] [place], [place] [address] [phone]

## 2023-08-30 NOTE — H&P (View-Only) (Signed)
 Primary Care Physician: Mariane Duval, MD Primary Cardiologist: Dr Georgana Curio Primary Electrophysiologist: Dr Elberta Fortis Referring Physician: Dr Claris Che Deantre Haefs is a 62 y.o. male with a history of persistent atrial fibrillation, CAD (CAC score of 636 in 2020), OSA, HTN, HLD who presents for consultation in the [facility].  The patient was initially diagnosed with atrial fibrillation 05/2018 at his PCP after presenting with symptoms of fatigue and exertional dyspnea. S/p DCCV on 06/19/18 and 06/30/18 with early return of afib. Now s/p afib ablation by Dr Elberta Fortis 09/12/18.   On evaluation today, he is currently in Afib. He contacted office on 12/9 noting he has been in Afib for the past couple of days. He notes acute stress due to wife having third surgery related to breast cancer and daughter going through divorce. He notes his HR was in the 130s this morning. He wears an Apple watch. Patient is not on anticoagulation.  On follow up 08/30/23, he is currently in Afib. He is on Xarelto 20 mg since last OV and missed a dose on 12/21. He feels tired and SOB when in Afib.   Today, he denies symptoms of palpitations, chest pain, shortness of breath, orthopnea, PND, lower extremity edema, dizziness, presyncope, syncope, snoring, daytime somnolence, bleeding, or neurologic sequela. The patient is tolerating medications without difficulties and is otherwise without complaint today.    Atrial Fibrillation Risk Factors:  he does have symptoms or diagnosis of sleep apnea. he is compliant with CPAP therapy. he does not have a history of rheumatic fever. he does not have a history of alcohol use. he has a BMI of Body mass index is 41.8 kg/m.Marland Kitchen Filed Weights   08/30/23 1005  Weight: (!) 143.7 kg    Family History  Problem Relation Age of Onset   Diabetes Father    Heart attack Brother 61   The patient does not have a history of early familial atrial  fibrillation or other arrhythmias.   Atrial Fibrillation Management history:  Previous antiarrhythmic drugs: amiodarone Previous cardioversions: 06/19/18, 06/30/18 Previous ablations: 09/12/18 CHADS2VASC score: 2 Anticoagulation history: Xarelto   Past Medical History:  Diagnosis Date   A-fib [facility])    ablation   Coronary artery disease    Dyslipidemia    Essential hypertension, benign    GERD (gastroesophageal reflux disease)    Gout, unspecified    History of kidney stones 2018   Morbid obesity (HCC)    OSA on CPAP    Past Surgical History:  Procedure Laterality Date   ATRIAL FIBRILLATION ABLATION N/A 09/12/2018   Procedure: ATRIAL FIBRILLATION ABLATION;  Surgeon: Regan Lemming, MD;  Location: [facility] INVAS[facility] CV LAB;  Service: Cardiovascular;  Laterality: N/A;   CARDIOVERSION  06/30/2018   CYSTOSCOPY WITH HOLMIUM LASER LITHOTRIPSY  2018   x2 with stent   SEPTOPLASTY  1990   TONSILLECTOMY  2015   TOTAL KNEE ARTHROPLASTY Left 07/20/2019   Procedure: TOTAL KNEE ARTHROPLASTY;  Surgeon: Ollen Gross, MD;  Location: [facility];  Service: Orthopedics;  Laterality: Left;    TOTAL KNEE ARTHROPLASTY Right 07/17/2021   Procedure: TOTAL KNEE ARTHROPLASTY;  Surgeon: Ollen Gross, MD;  Location: [facility];  Service: Orthopedics;  Laterality: Right;    Current Outpatient Medications  Medication Sig Dispense Refill   acetaminophen (TYLENOL) 500 MG tablet Take 1,000 mg by mouth every 6 (six) hours as needed for mild pain or headache.     atenolol (TENORMIN) 50 MG tablet  TAKE 1 TABLET(50 MG) BY MOUTH DAILY 90 tablet 3   atorvastatin (LIPITOR) 40 MG tablet Take 1 tablet (40 mg total) by mouth daily. 90 tablet 3   benazepril (LOTENSIN) 40 MG tablet Take 20 mg by mouth daily.     cetirizine (ZYRTEC) 10 MG tablet Take 10 mg by mouth daily.     diltiazem (CARDIZEM) 30 MG tablet Take 1 tablet (30 mg total) by mouth 2 (two) times daily. 60 tablet 3   fenofibrate 160 MG tablet Take 160 mg  by mouth daily.     hydrochlorothiazide (HYDRODIURIL) 25 MG tablet Take 12.5 mg by mouth daily.     methocarbamol (ROBAXIN) 500 MG tablet Take 1 tablet (500 mg total) by mouth every 6 (six) hours as needed for muscle spasms. 40 tablet 0   naphazoline-pheniramine (VISINE-A) 0.025-0.3 % ophthalmic solution Place 1 drop into both eyes daily as needed for eye irritation.     Omega-3 Fatty Acids (FISH OIL) 1000 MG CAPS Take 1 capsule by mouth daily.     omeprazole (PRILOSEC) 20 MG capsule Take 20 mg by mouth daily.     rivaroxaban (XARELTO) 20 MG TABS tablet Take 1 tablet (20 mg total) by mouth daily with supper. 30 tablet 4   No current facility-administered medications for this encounter.    No Known Allergies  ROS- All systems are reviewed and negative except as per the HPI above.  Physical Exam: Vitals:   08/30/23 1005  BP: 116/80  Pulse: (!) 101  Weight: (!) 143.7 kg  Height: 6\' 1"  (1.854 m)    GEN- The patient is well appearing, alert and oriented x 3 today.   Neck - no JVD or carotid bruit noted Lungs- Clear to ausculation bilaterally, normal work of breathing Heart- Irregular rate and rhythm, no murmurs, rubs or gallops, PMI not laterally displaced Extremities- no clubbing, cyanosis, or edema Skin - no rash or ecchymosis noted   Wt Readings from Last 3 Encounters:  08/30/23 (!) 143.7 kg  08/14/23 (!) 140.7 kg  01/25/23 133.4 kg    EKG today demonstrates  Vent. rate 101 BPM PR interval * ms QRS duration 76 ms QT/QTcB 330/427 ms P-R-T axes * 48 8 Atrial fibrillation with rapid ventricular response Abnormal ECG When compared with ECG of 14-Aug-2023 10:41, PREVIOUS ECG IS PRESENT  Echo 06/16/18 demonstrated LVEF 55-60%, mild LVH, mild MR, mild aortic root dilation (43mm), LA normal size.  Epic records are reviewed at length today  CHA2DS2-VASc Score = 2  The patient's score is based upon: CHF History: 0 HTN History: 1 Diabetes History: 0 Stroke History:  0 Vascular Disease History: 1 Age Score: 0 Gender Score: 0      Assessment and Plan:  1. Persistent atrial fibrillation S/p afib ablation by Dr Elberta Fortis 09/12/18.   He is currently in Afib. We discussed options for returning to normal rhythm. He appears to have overall low burden of Afib (wears Apple watch) with new episode likely secondary to acute stress. He would like to proceed with cardioversion in order to try and convert to NSR. Labs drawn today. He missed a dose of Xarelto on 12/21 and emphasis on adherence mentioned.   Informed Consent   Shared Decision Making/Informed Consent The risks (stroke, cardiac arrhythmias rarely resulting in the need for a temporary or permanent pacemaker, skin irritation or burns and complications associated with conscious sedation including aspiration, arrhythmia, respiratory failure and death), benefits (restoration of normal sinus rhythm) and alternatives of a direct  current cardioversion were explained in detail to Mr. Tefft and he agrees to proceed.      Continue diltiazem 30 mg BID.   2. Secondary hypercoagulable state due to atrial fibrillation  Continue Xarelto 20 mg daily without interruption.   3. HTN Stable, no changes today.  4. Obstructive sleep apnea He uses his CPAP nightly.   5. Obesity Body mass index is 41.8 kg/m. He has joined a gym to try and lose weight.   Follow up 2 weeks after DCCV.   Justin Mend, PA-C Afib Clinic [facility] [address] [place], [place] [address] [phone]

## 2023-09-06 ENCOUNTER — Ambulatory Visit (HOSPITAL_COMMUNITY): Payer: BC Managed Care – PPO | Admitting: Physician Assistant

## 2023-09-13 DIAGNOSIS — Z96653 Presence of artificial knee joint, bilateral: Secondary | ICD-10-CM | POA: Diagnosis not present

## 2023-09-19 NOTE — Progress Notes (Signed)
 Spoke to pt and instructed them to come at 0800 and to be NPO after 0000.  Confirmed no missed doses of AC and instructed to take in AM with a small sip of water.  Confirmed that pt will have a ride home and someone to stay with them for 24 hours after the procedure. Instructed patient to not wear any jewelry or lotion.

## 2023-09-19 NOTE — Anesthesia Preprocedure Evaluation (Addendum)
Anesthesia Evaluation  Patient identified by MRN, date of birth, ID band Patient awake    Reviewed: Allergy & Precautions, NPO status , Patient's Chart, lab work & pertinent test results, reviewed documented beta blocker date and time   Airway Mallampati: III  TM Distance: >3 FB Neck ROM: Full    Dental  (+) Dental Advisory Given   Pulmonary sleep apnea and Continuous Positive Airway Pressure Ventilation    Pulmonary exam normal breath sounds clear to auscultation       Cardiovascular hypertension, Pt. on home beta blockers and Pt. on medications + CAD  + dysrhythmias Atrial Fibrillation  Rhythm:Irregular Rate:Tachycardia  Stress MPS 2020  Nuclear stress EF: 61%. The left ventricular ejection fraction is normal (55-65%).  There was no ST segment deviation noted during stress.  This is a low risk study. There is no evidence of ischemia and no evidence of previous infarction  The study is normal.   Neuro/Psych negative neurological ROS  negative psych ROS   GI/Hepatic Neg liver ROS,GERD  Medicated and Controlled,,  Endo/Other    Class 3 obesity  Renal/GU negative Renal ROS     Musculoskeletal  (+) Arthritis , Osteoarthritis,    Abdominal  (+) + obese  Peds negative pediatric ROS (+)  Hematology negative hematology ROS (+)   Anesthesia Other Findings   Reproductive/Obstetrics negative OB ROS                             Anesthesia Physical Anesthesia Plan  ASA: 3  Anesthesia Plan: General   Post-op Pain Management: Minimal or no pain anticipated   Induction: Intravenous  PONV Risk Score and Plan: 2 and Propofol infusion, Treatment may vary due to age or medical condition and TIVA  Airway Management Planned: Simple Face Mask  Additional Equipment:   Intra-op Plan:   Post-operative Plan:   Informed Consent: I have reviewed the patients History and Physical, chart, labs and  discussed the procedure including the risks, benefits and alternatives for the proposed anesthesia with the patient or authorized representative who has indicated his/her understanding and acceptance.     Dental advisory given  Plan Discussed with: CRNA  Anesthesia Plan Comments: (See PAT note 07/10/2021, Jodell Cipro Ward, PA-C)        Anesthesia Quick Evaluation

## 2023-09-20 ENCOUNTER — Ambulatory Visit (HOSPITAL_COMMUNITY)
Admission: RE | Admit: 2023-09-20 | Discharge: 2023-09-20 | Disposition: A | Discharge status: Home / Self Care | Payer: BC Managed Care – PPO | Attending: Internal Medicine | Admitting: Internal Medicine

## 2023-09-20 ENCOUNTER — Ambulatory Visit (HOSPITAL_COMMUNITY): Payer: BC Managed Care – PPO | Admitting: Certified Registered"

## 2023-09-20 ENCOUNTER — Encounter (HOSPITAL_COMMUNITY)
Admission: RE | Disposition: A | Discharge status: Home / Self Care | Payer: Self-pay | Source: Home / Self Care | Attending: Internal Medicine

## 2023-09-20 ENCOUNTER — Other Ambulatory Visit: Payer: Self-pay

## 2023-09-20 DIAGNOSIS — I10 Essential (primary) hypertension: Secondary | ICD-10-CM | POA: Diagnosis not present

## 2023-09-20 DIAGNOSIS — Z6841 Body Mass Index (BMI) 40.0 and over, adult: Secondary | ICD-10-CM | POA: Insufficient documentation

## 2023-09-20 DIAGNOSIS — D6869 Other thrombophilia: Secondary | ICD-10-CM | POA: Diagnosis not present

## 2023-09-20 DIAGNOSIS — I4891 Unspecified atrial fibrillation: Secondary | ICD-10-CM

## 2023-09-20 DIAGNOSIS — Z7901 Long term (current) use of anticoagulants: Secondary | ICD-10-CM | POA: Diagnosis not present

## 2023-09-20 DIAGNOSIS — I2510 Atherosclerotic heart disease of native coronary artery without angina pectoris: Secondary | ICD-10-CM | POA: Diagnosis not present

## 2023-09-20 DIAGNOSIS — I4819 Other persistent atrial fibrillation: Secondary | ICD-10-CM | POA: Insufficient documentation

## 2023-09-20 DIAGNOSIS — G4730 Sleep apnea, unspecified: Secondary | ICD-10-CM | POA: Diagnosis not present

## 2023-09-20 DIAGNOSIS — G4733 Obstructive sleep apnea (adult) (pediatric): Secondary | ICD-10-CM | POA: Insufficient documentation

## 2023-09-20 DIAGNOSIS — Z8249 Family history of ischemic heart disease and other diseases of the circulatory system: Secondary | ICD-10-CM | POA: Insufficient documentation

## 2023-09-20 DIAGNOSIS — K219 Gastro-esophageal reflux disease without esophagitis: Secondary | ICD-10-CM | POA: Diagnosis not present

## 2023-09-20 HISTORY — PX: CARDIOVERSION: EP1203

## 2023-09-20 SURGERY — CARDIOVERSION (CATH LAB)
Anesthesia: General

## 2023-09-20 MED ORDER — LIDOCAINE 2% (20 MG/ML) 5 ML SYRINGE
INTRAMUSCULAR | Status: DC | PRN
  Administered 2023-09-20: 100 mg via INTRAVENOUS

## 2023-09-20 MED ORDER — SODIUM CHLORIDE 0.9% FLUSH
3.0000 mL | Freq: Two times a day (BID) | INTRAVENOUS | Status: DC
Start: 2023-09-20 — End: 2023-09-20

## 2023-09-20 MED ORDER — SODIUM CHLORIDE 0.9% FLUSH: 3.0000 mL | INTRAVENOUS | Status: DC | PRN

## 2023-09-20 MED ORDER — PROPOFOL 10 MG/ML IV BOLUS
INTRAVENOUS | Status: DC | PRN
  Administered 2023-09-20: 70 mg via INTRAVENOUS

## 2023-09-20 SURGICAL SUPPLY — 1 items: PAD DEFIB RADIO PHYSIO CONN (PAD) ×1 IMPLANT

## 2023-09-20 NOTE — CV Procedure (Signed)
Electrical Cardioversion Procedure Note Austin Barker [id number] [birth year]  Procedure: Electrical Cardioversion Indications:  Atrial Fibrillation  Procedure Details Consent: Risks of procedure as well as the alternatives and risks of each were explained to the (patient/caregiver).  Consent for procedure obtained. Time Out: Verified patient identification, verified procedure, site/side was marked, verified correct patient position, special equipment/implants available, medications/allergies/relevent history reviewed, required imaging and test results available.  Performed  Patient placed on cardiac monitor, pulse oximetry, supplemental oxygen as necessary.  Sedation given:  propofol and lidocaine  Pacer pads placed anterior and posterior chest.  Cardioverted 1 time(s).  Cardioverted at 300J biphasic  Evaluation Findings: Post procedure EKG shows: NSR Complications: None Patient did tolerate procedure well.   Austin Barker 09/20/2023, 9:29 AM

## 2023-09-20 NOTE — Interval H&P Note (Signed)
History and Physical Interval Note:  09/20/2023 9:18 AM  Austin Barker  has presented today for surgery, with the diagnosis of afib.  The various methods of treatment have been discussed with the patient and family. After consideration of risks, benefits and other options for treatment, the patient has consented to  Procedure(s): CARDIOVERSION (N/A) as a surgical intervention.  The patient's history has been reviewed, patient examined, no change in status, stable for surgery.  I have reviewed the patient's chart and labs.  Questions were answered to the patient's satisfaction.     [name]

## 2023-09-20 NOTE — Transfer of Care (Signed)
Immediate Anesthesia Transfer of Care Note  Patient: Austin Barker  Procedure(s) Performed: CARDIOVERSION  Patient Location: PACU and Cath Lab  Anesthesia Type:General  Level of Consciousness: awake, alert , and oriented  Airway & Oxygen Therapy: Patient Spontanous Breathing and Patient connected to nasal cannula oxygen  Post-op Assessment: Report given to RN and Post -op Vital signs reviewed and stable  Post vital signs: Reviewed and stable  Last Vitals:  Vitals Value Taken Time  BP    Temp    Pulse    Resp    SpO2      Last Pain:  Vitals:   09/20/23 0807  TempSrc: Temporal         Complications: No notable events documented.

## 2023-09-20 NOTE — Anesthesia Postprocedure Evaluation (Signed)
Anesthesia Post Note  Patient: Austin Barker  Procedure(s) Performed: CARDIOVERSION     Patient location during evaluation: Cath Lab Anesthesia Type: General Level of consciousness: sedated and patient cooperative Pain management: pain level controlled Vital Signs Assessment: post-procedure vital signs reviewed and stable Respiratory status: spontaneous breathing Cardiovascular status: stable Anesthetic complications: no   No notable events documented.  Last Vitals:  Vitals:   09/20/23 0945 09/20/23 1000  BP: (!) 140/91   Pulse: 78 79  Resp: 18 13  Temp:    SpO2: 97% 98%    Last Pain:  Vitals:   09/20/23 0937  TempSrc: Temporal                 Lewie Loron

## 2023-09-23 ENCOUNTER — Encounter (HOSPITAL_COMMUNITY): Payer: Self-pay | Admitting: Internal Medicine

## 2023-09-25 ENCOUNTER — Other Ambulatory Visit (HOSPITAL_COMMUNITY): Payer: Self-pay | Admitting: Orthopedic Surgery

## 2023-09-25 DIAGNOSIS — Z96653 Presence of artificial knee joint, bilateral: Secondary | ICD-10-CM

## 2023-10-04 ENCOUNTER — Ambulatory Visit (HOSPITAL_COMMUNITY)
Admission: RE | Admit: 2023-10-04 | Discharge: 2023-10-04 | Disposition: A | Discharge status: Home / Self Care | Payer: BC Managed Care – PPO | Source: Ambulatory Visit | Attending: Internal Medicine | Admitting: Internal Medicine

## 2023-10-04 ENCOUNTER — Encounter (HOSPITAL_COMMUNITY): Payer: Self-pay | Admitting: Internal Medicine

## 2023-10-04 ENCOUNTER — Encounter (HOSPITAL_COMMUNITY)
Admission: RE | Admit: 2023-10-04 | Discharge: 2023-10-04 | Disposition: A | Discharge status: Home / Self Care | Payer: BC Managed Care – PPO | Source: Ambulatory Visit | Attending: Orthopedic Surgery | Admitting: Orthopedic Surgery

## 2023-10-04 VITALS — BP 118/100 | HR 92 | Ht 73.0 in | Wt 311.6 lb

## 2023-10-04 DIAGNOSIS — E669 Obesity, unspecified: Secondary | ICD-10-CM | POA: Insufficient documentation

## 2023-10-04 DIAGNOSIS — D6869 Other thrombophilia: Secondary | ICD-10-CM | POA: Insufficient documentation

## 2023-10-04 DIAGNOSIS — I4819 Other persistent atrial fibrillation: Secondary | ICD-10-CM | POA: Insufficient documentation

## 2023-10-04 DIAGNOSIS — Z96653 Presence of artificial knee joint, bilateral: Secondary | ICD-10-CM

## 2023-10-04 DIAGNOSIS — Z7901 Long term (current) use of anticoagulants: Secondary | ICD-10-CM | POA: Insufficient documentation

## 2023-10-04 DIAGNOSIS — M25569 Pain in unspecified knee: Secondary | ICD-10-CM | POA: Diagnosis not present

## 2023-10-04 DIAGNOSIS — R9431 Abnormal electrocardiogram [ECG] [EKG]: Secondary | ICD-10-CM | POA: Insufficient documentation

## 2023-10-04 DIAGNOSIS — Z6841 Body Mass Index (BMI) 40.0 and over, adult: Secondary | ICD-10-CM | POA: Insufficient documentation

## 2023-10-04 DIAGNOSIS — I10 Essential (primary) hypertension: Secondary | ICD-10-CM | POA: Diagnosis not present

## 2023-10-04 DIAGNOSIS — E785 Hyperlipidemia, unspecified: Secondary | ICD-10-CM | POA: Diagnosis not present

## 2023-10-04 DIAGNOSIS — G8929 Other chronic pain: Secondary | ICD-10-CM | POA: Diagnosis not present

## 2023-10-04 DIAGNOSIS — I2510 Atherosclerotic heart disease of native coronary artery without angina pectoris: Secondary | ICD-10-CM | POA: Diagnosis not present

## 2023-10-04 DIAGNOSIS — G4733 Obstructive sleep apnea (adult) (pediatric): Secondary | ICD-10-CM | POA: Insufficient documentation

## 2023-10-04 MED ORDER — TECHNETIUM TC 99M MEDRONATE IV KIT
20.0000 | PACK | Freq: Once | INTRAVENOUS | Status: AC | PRN
  Administered 2023-10-04: 20.2 via INTRAVENOUS

## 2023-10-04 MED ORDER — AMIODARONE HCL 200 MG PO TABS: 200.0000 mg | ORAL_TABLET | Freq: Two times a day (BID) | ORAL | 3 refills | Status: DC

## 2023-10-04 NOTE — Progress Notes (Signed)
Primary Care Physician: Mariane Duval, MD Primary Cardiologist: Dr Georgana Curio Primary Electrophysiologist: Dr Elberta Fortis Referring Physician: Dr Claris Che Austin Barker is a 63 y.o. male with a history of persistent atrial fibrillation, CAD (CAC score of 636 in 2020), OSA, HTN, HLD who presents for consultation in the [facility].  The patient was initially diagnosed with atrial fibrillation 05/2018 at his PCP after presenting with symptoms of fatigue and exertional dyspnea. S/p DCCV on 06/19/18 and 06/30/18 with early return of afib. Now s/p afib ablation by Dr Elberta Fortis 09/12/18.   On evaluation today, he is currently in Afib. He contacted office on 12/9 noting he has been in Afib for the past couple of days. He notes acute stress due to wife having third surgery related to breast cancer and daughter going through divorce. He notes his HR was in the 130s this morning. He wears an Apple watch. Patient is not on anticoagulation.  On follow up 08/30/23, he is currently in Afib. He is on Xarelto 20 mg since last OV and missed a dose on 12/21. He feels tired and SOB when in Afib.   On follow up 10/04/23, he is currently in Afib. S/p successful DCCV on 09/20/23. He went back into Afib last Saturday 1/25. He feels tired when in Afib.   Today, he denies symptoms of palpitations, chest pain, shortness of breath, orthopnea, PND, lower extremity edema, dizziness, presyncope, syncope, snoring, daytime somnolence, bleeding, or neurologic sequela. The patient is tolerating medications without difficulties and is otherwise without complaint today.    Atrial Fibrillation Risk Factors:  he does have symptoms or diagnosis of sleep apnea. he is compliant with CPAP therapy. he does not have a history of rheumatic fever. he does not have a history of alcohol use. he has a BMI of Body mass index is 41.11 kg/m.Marland Kitchen Filed Weights   10/04/23 1012  Weight: (!) 141.3 kg     Family  History  Problem Relation Age of Onset   Diabetes Father    Heart attack Brother 32   The patient does not have a history of early familial atrial fibrillation or other arrhythmias.   Atrial Fibrillation Management history:  Previous antiarrhythmic drugs: amiodarone Previous cardioversions: 06/19/18, 06/30/18, 09/20/23 Previous ablations: 09/12/18 CHADS2VASC score: 2 Anticoagulation history: Xarelto   Past Medical History:  Diagnosis Date   A-fib [facility])    ablation   Coronary artery disease    Dyslipidemia    Essential hypertension, benign    GERD (gastroesophageal reflux disease)    Gout, unspecified    History of kidney stones 2018   Morbid obesity (HCC)    OSA on CPAP    Past Surgical History:  Procedure Laterality Date   ATRIAL FIBRILLATION ABLATION N/A 09/12/2018   Procedure: ATRIAL FIBRILLATION ABLATION;  Surgeon: Regan Lemming, MD;  Location: [facility] INVASIVE CV LAB;  Service: Cardiovascular;  Laterality: N/A;   CARDIOVERSION  06/30/2018   CARDIOVERSION N/A 09/20/2023   Procedure: CARDIOVERSION;  Surgeon: Pricilla Riffle, MD;  Location: [facility] INVASIVE CV LAB;  Service: Cardiovascular;  Laterality: N/A;   CYSTOSCOPY WITH HOLMIUM LASER LITHOTRIPSY  2018   x2 with stent   SEPTOPLASTY  1990   TONSILLECTOMY  2015   TOTAL KNEE ARTHROPLASTY Left 07/20/2019   Procedure: TOTAL KNEE ARTHROPLASTY;  Surgeon: Ollen Gross, MD;  Location: [facility] ORS;  Service: Orthopedics;  Laterality: Left;    TOTAL KNEE ARTHROPLASTY Right 07/17/2021   Procedure: TOTAL KNEE  ARTHROPLASTY;  Surgeon: Ollen Gross, MD;  Location: [facility] ORS;  Service: Orthopedics;  Laterality: Right;    Current Outpatient Medications  Medication Sig Dispense Refill   acetaminophen (TYLENOL) 500 MG tablet Take 1,000 mg by mouth every 6 (six) hours as needed for mild pain or headache.     amiodarone (PACERONE) 200 MG tablet Take 1 tablet (200 mg total) by mouth 2 (two) times daily. 180 tablet 3   atenolol (TENORMIN)  50 MG tablet TAKE 1 TABLET(50 MG) BY MOUTH DAILY 90 tablet 3   atorvastatin (LIPITOR) 40 MG tablet Take 1 tablet (40 mg total) by mouth daily. 90 tablet 3   benazepril (LOTENSIN) 40 MG tablet Take 20 mg by mouth daily.     cetirizine (ZYRTEC) 10 MG tablet Take 10 mg by mouth daily.     diltiazem (CARDIZEM) 30 MG tablet Take 1 tablet (30 mg total) by mouth 2 (two) times daily. 60 tablet 3   fenofibrate 160 MG tablet Take 160 mg by mouth daily.     gabapentin (NEURONTIN) 300 MG capsule Take by mouth at bedtime.     hydrochlorothiazide (HYDRODIURIL) 25 MG tablet Take 12.5 mg by mouth daily.     loperamide (IMODIUM A-D) 2 MG tablet Take 2-4 mg by mouth as needed for diarrhea or loose stools.     Naphazoline HCl (CLEAR EYES OP) Place 1 drop into both eyes daily.     Omega-3 Fatty Acids (FISH OIL) 1000 MG CAPS Take 1 capsule by mouth daily.     omeprazole (PRILOSEC) 20 MG capsule Take 20 mg by mouth daily.     rivaroxaban (XARELTO) 20 MG TABS tablet Take 1 tablet (20 mg total) by mouth daily with supper. 30 tablet 4   No current facility-administered medications for this encounter.    No Known Allergies  ROS- All systems are reviewed and negative except as per the HPI above.  Physical Exam: Vitals:   10/04/23 1012  BP: (!) 118/100  Pulse: 92  Weight: (!) 141.3 kg  Height: 6\' 1"  (1.854 m)    GEN- The patient is well appearing, alert and oriented x 3 today.   Neck - no JVD or carotid bruit noted Lungs- Clear to ausculation bilaterally, normal work of breathing Heart- Irregular rate and rhythm, no murmurs, rubs or gallops, PMI not laterally displaced Extremities- no clubbing, cyanosis, or edema Skin - no rash or ecchymosis noted   Wt Readings from Last 3 Encounters:  10/04/23 (!) 141.3 kg  09/20/23 (!) 141.5 kg  08/30/23 (!) 143.7 kg    EKG today demonstrates  Vent. rate 92 BPM PR interval * ms QRS duration 80 ms QT/QTcB 348/430 ms P-R-T axes * 57 53 Atrial  fibrillation Abnormal ECG When compared with ECG of 20-Sep-2023 09:50, PREVIOUS ECG IS PRESENT  Echo 06/16/18 demonstrated LVEF 55-60%, mild LVH, mild MR, mild aortic root dilation (43mm), LA normal size.  Epic records are reviewed at length today  CHA2DS2-VASc Score = 2  The patient's score is based upon: CHF History: 0 HTN History: 1 Diabetes History: 0 Stroke History: 0 Vascular Disease History: 1 Age Score: 0 Gender Score: 0      Assessment and Plan:  1. Persistent atrial fibrillation S/p afib ablation by Dr Elberta Fortis 09/12/18.  S/p successful DCCV on 09/20/23.   He is currently in Afib. We had a long discussion about medication treatments and ablation in detail. We discussed potential options such as Multaq, Tikosyn, and amiodarone. We talked about the  monitoring required for these medications, hospital admission for Tikosyn, and potential adverse effects. We also discussed pulse field ablation as an option in the form of intervention. After discussion, patient is interested in speaking with EP regarding potential for repeat Afib ablation. We will begin amiodarone for rhythm control as a bridge to ablation. Begin amiodarone 200 mg BID x 1 month then transition to 200 mg once daily. Follow up 3 weeks to determine if needs repeat cardioversion.  Continue diltiazem 30 mg BID.   2. Secondary hypercoagulable state due to atrial fibrillation Continue Xarelto 20 mg daily.   3. HTN Diastolic is elevated, continue to monitor at home.   4. Obstructive sleep apnea He uses his CPAP regularly.  5. Obesity Body mass index is 41.11 kg/m. Continue to exercise as tolerated.    Follow up 3 weeks Afib clinic.   Justin Mend, PA-C Afib Clinic [facility] [address] [place], [place] [address] [phone]

## 2023-10-04 NOTE — Patient Instructions (Signed)
Amiodarone 200mg  twice daily for one month and decrease to once daily

## 2023-10-08 ENCOUNTER — Telehealth: Payer: Self-pay | Admitting: Cardiology

## 2023-10-08 NOTE — Telephone Encounter (Signed)
 Patient c/o Palpitations:  High priority if patient c/o lightheadedness, shortness of breath, or chest pain  How long have you had palpitations/irregular HR/ Afib? Are you having the symptoms now?  Been in afib since cardioversion, still in afib currently  Are you currently experiencing lightheadedness, SOB or CP?  No   Do you have a history of afib (atrial fibrillation) or irregular heart rhythm?  Yes   Have you checked your BP or HR? (document readings if available):  Averages high 80's, 100 bpm with activity 94 bpm currently  Are you experiencing any other symptoms?  Tiredness

## 2023-10-08 NOTE — Telephone Encounter (Signed)
 Spoke with patient and he wanted to follow up about having ablation done and his appointment on Friday with AFIB clinic. He states he recently had a cardioversion butt he is back in AFIB. He saw Terra on 1/31 and they discussed ablation and wanted to follow up and get scheduled for ablation.  He is currently asymptomatic but still in AFIB

## 2023-10-11 NOTE — Telephone Encounter (Signed)
 Pt scheduled to see Dr. Lawana Pray 10/21/23

## 2023-10-20 NOTE — Progress Notes (Unsigned)
Electrophysiology Office Note:   Date:  10/21/2023  ID:  Austin Barker, DOB [birth year], MRN [id number]  Primary Cardiologist: None Primary Heart Failure: None Electrophysiologist: Jleigh Striplin Jorja Loa, MD      History of Present Illness:   Austin Barker is a 63 y.o. male with h/o sleep apnea, hypertension, hyperlipidemia, obesity, atrial fibrillation seen today for routine electrophysiology followup.   He is post ablation in 2020.  He presented to A-fib clinic in atrial fibrillation.  He is now post cardioversion on 09/20/2023.  Unfortunately, he has gone back into atrial fibrillation.  He feels tired, fatigued, short of breath.  Since last being seen in our clinic the patient reports notes increased fatigue, weakness, shortness of breath since being in atrial fibrillation.  He had a prior cardioversion and did go back into atrial fibrillation quite rapidly.  Now that he is on amiodarone, he is okay for repeat cardioversion.  Ultimately, he would prefer ablation to medical management.  he denies chest pain, palpitations, dyspnea, PND, orthopnea, nausea, vomiting, dizziness, syncope, edema, weight gain, or early satiety.   Review of systems complete and found to be negative unless listed in HPI.   EP Information / Studies Reviewed:    EKG is ordered today. Personal review as below.  EKG Interpretation Date/Time:  Monday October 21 2023 10:49:17 EST Ventricular Rate:  110 PR Interval:    QRS Duration:  74 QT Interval:  338 QTC Calculation: 457 R Axis:   80  Text Interpretation: Atrial fibrillation with rapid ventricular response Nonspecific ST and T wave abnormality When compared with ECG of 21-Oct-2023 10:48, No significant change was found Confirmed by Sandralee Tarkington ([id number]) on 10/21/2023 10:53:16 AM     Risk Assessment/Calculations:    CHA2DS2-VASc Score = 2   This indicates a 2.2% annual risk of stroke. The patient's score is based upon: CHF History: 0 HTN  History: 1 Diabetes History: 0 Stroke History: 0 Vascular Disease History: 1 Age Score: 0 Gender Score: 0             Physical Exam:   VS:  BP 118/64 (BP Location: Left Arm, Patient Position: Sitting, Cuff Size: Large)   Pulse (!) 128   Ht 6\' 1"  (1.854 m)   Wt 295 lb (133.8 kg)   SpO2 97%   BMI 38.92 kg/m    Wt Readings from Last 3 Encounters:  10/21/23 295 lb (133.8 kg)  10/04/23 (!) 311 lb 9.6 oz (141.3 kg)  09/20/23 (!) 312 lb (141.5 kg)     GEN: Well nourished, well developed in no acute distress NECK: No JVD; No carotid bruits CARDIAC: Irregularly irregular rate and rhythm, no murmurs, rubs, gallops RESPIRATORY:  Clear to auscultation without rales, wheezing or rhonchi  ABDOMEN: Soft, non-tender, non-distended EXTREMITIES:  No edema; No deformity   ASSESSMENT AND PLAN:    1.  Persistent atrial fibrillation: Post ablation 20.  He has had recurrence of his atrial fibrillation.  Currently on amiodarone ongoing load.  Due to his age, amiodarone should be a short-term medication.  He would prefer repeat ablation.  Risk and benefits have been discussed.  He understands the risks and is agreed to the procedure.  Risk, benefits, and alternatives to EP study and radiofrequency/pulse field ablation for afib were also discussed in detail today. These risks include but are not limited to stroke, bleeding, vascular damage, tamponade, perforation, damage to the esophagus, lungs, and other structures, pulmonary vein stenosis, worsening renal function, and death. The patient  understands these risk and wishes to proceed.  We Catherina Pates therefore proceed with catheter ablation at the next available time.  Carto, ICE, anesthesia are requested for the procedure.  Hadli Vandemark also obtain CT PV protocol prior to the procedure to exclude LAA thrombus and further evaluate atrial anatomy.  2.  Secondary hypercoagulable state: Currently on Xarelto 20 mg daily for atrial fibrillation  3.  Hypertension:  Currently well-controlled  4.  Obstructive sleep apnea: CPAP compliance encouraged  Follow up with Dr. Elberta Fortis as usual post procedure  Signed, Marcayla Budge Jorja Loa, MD

## 2023-10-20 NOTE — H&P (View-Only) (Signed)
 Electrophysiology Office Note:   Date:  10/21/2023  ID:  Austin Barker, DOB 10/07/1960, MRN [id number]  Primary Cardiologist: None Primary Heart Failure: None Electrophysiologist: Axzel Rockhill Jorja Loa, MD      History of Present Illness:   Austin Barker is a 63 y.o. male with h/o sleep apnea, hypertension, hyperlipidemia, obesity, atrial fibrillation seen today for routine electrophysiology followup.   He is post ablation in 2020.  He presented to A-fib clinic in atrial fibrillation.  He is now post cardioversion on 09/20/2023.  Unfortunately, he has gone back into atrial fibrillation.  He feels tired, fatigued, short of breath.  Since last being seen in our clinic the patient reports notes increased fatigue, weakness, shortness of breath since being in atrial fibrillation.  He had a prior cardioversion and did go back into atrial fibrillation quite rapidly.  Now that he is on amiodarone, he is okay for repeat cardioversion.  Ultimately, he would prefer ablation to medical management.  he denies chest pain, palpitations, dyspnea, PND, orthopnea, nausea, vomiting, dizziness, syncope, edema, weight gain, or early satiety.   Review of systems complete and found to be negative unless listed in HPI.   EP Information / Studies Reviewed:    EKG is ordered today. Personal review as below.  EKG Interpretation Date/Time:  Monday October 21 2023 10:49:17 EST Ventricular Rate:  110 PR Interval:    QRS Duration:  74 QT Interval:  338 QTC Calculation: 457 R Axis:   80  Text Interpretation: Atrial fibrillation with rapid ventricular response Nonspecific ST and T wave abnormality When compared with ECG of 21-Oct-2023 10:48, No significant change was found Confirmed by Jamia Hoban ([id number]) on 10/21/2023 10:53:16 AM     Risk Assessment/Calculations:    CHA2DS2-VASc Score = 2   This indicates a 2.2% annual risk of stroke. The patient's score is based upon: CHF History: 0 HTN  History: 1 Diabetes History: 0 Stroke History: 0 Vascular Disease History: 1 Age Score: 0 Gender Score: 0             Physical Exam:   VS:  BP 118/64 (BP Location: Left Arm, Patient Position: Sitting, Cuff Size: Large)   Pulse (!) 128   Ht 6\' 1"  (1.854 m)   Wt 295 lb (133.8 kg)   SpO2 97%   BMI 38.92 kg/m    Wt Readings from Last 3 Encounters:  10/21/23 295 lb (133.8 kg)  10/04/23 (!) 311 lb 9.6 oz (141.3 kg)  09/20/23 (!) 312 lb (141.5 kg)     GEN: Well nourished, well developed in no acute distress NECK: No JVD; No carotid bruits CARDIAC: Irregularly irregular rate and rhythm, no murmurs, rubs, gallops RESPIRATORY:  Clear to auscultation without rales, wheezing or rhonchi  ABDOMEN: Soft, non-tender, non-distended EXTREMITIES:  No edema; No deformity   ASSESSMENT AND PLAN:    1.  Persistent atrial fibrillation: Post ablation 20.  He has had recurrence of his atrial fibrillation.  Currently on amiodarone ongoing load.  Due to his age, amiodarone should be a short-term medication.  He would prefer repeat ablation.  Risk and benefits have been discussed.  He understands the risks and is agreed to the procedure.  Risk, benefits, and alternatives to EP study and radiofrequency/pulse field ablation for afib were also discussed in detail today. These risks include but are not limited to stroke, bleeding, vascular damage, tamponade, perforation, damage to the esophagus, lungs, and other structures, pulmonary vein stenosis, worsening renal function, and death. The patient  understands these risk and wishes to proceed.  We [name] therefore proceed with catheter ablation at the next available time.  Carto, ICE, anesthesia are requested for the procedure.  [name] also obtain CT PV protocol prior to the procedure to exclude LAA thrombus and further evaluate atrial anatomy.  2.  Secondary hypercoagulable state: Currently on Xarelto 20 mg daily for atrial fibrillation  3.  Hypertension:  Currently well-controlled  4.  Obstructive sleep apnea: CPAP compliance encouraged  Follow up with Dr. Elberta Fortis as usual post procedure  Signed, Skylynne Schlechter Jorja Loa, MD

## 2023-10-21 ENCOUNTER — Ambulatory Visit: Payer: BC Managed Care – PPO | Attending: Cardiology | Admitting: Cardiology

## 2023-10-21 ENCOUNTER — Encounter: Payer: Self-pay | Admitting: *Deleted

## 2023-10-21 ENCOUNTER — Encounter: Payer: Self-pay | Admitting: Cardiology

## 2023-10-21 VITALS — BP 118/64 | HR 128 | Ht 73.0 in | Wt 295.0 lb

## 2023-10-21 DIAGNOSIS — G4733 Obstructive sleep apnea (adult) (pediatric): Secondary | ICD-10-CM | POA: Diagnosis not present

## 2023-10-21 DIAGNOSIS — I4819 Other persistent atrial fibrillation: Secondary | ICD-10-CM | POA: Diagnosis not present

## 2023-10-21 DIAGNOSIS — D6869 Other thrombophilia: Secondary | ICD-10-CM

## 2023-10-21 DIAGNOSIS — I10 Essential (primary) hypertension: Secondary | ICD-10-CM

## 2023-10-21 DIAGNOSIS — Z01812 Encounter for preprocedural laboratory examination: Secondary | ICD-10-CM

## 2023-10-21 NOTE — Addendum Note (Signed)
Addended by: Baird Lyons on: 10/21/2023 01:29 PM   Modules accepted: Orders

## 2023-10-21 NOTE — Patient Instructions (Addendum)
Medication Instructions:  Your physician has recommended you make the following change in your medication:  INCREASE Amiodarone to 2 tablets twice daily until cardioversion.  After cardioversion go down to 1 tablet once a day  *If you need a refill on your cardiac medications before your next appointment, please call your pharmacy*   Lab Work: Pre procedure labs -- we will call you to schedule:  BMP & CBC  If you have a lab test that is abnormal and we need to change your treatment, we will call you to review the results -- otherwise no news is good news.    Testing/Procedures: Your physician has recommended that you have a Cardioversion (DCCV). Electrical Cardioversion uses a jolt of electricity to your heart either through paddles or wired patches attached to your chest. This is a controlled, usually prescheduled, procedure. Defibrillation is done under light anesthesia in the hospital, and you usually go home the day of the procedure. This is done to get your heart back into a normal rhythm. You are not awake for the procedure. Please see the instruction sheet given to you today.  Your physician has requested that you have cardiac CT 1 month PRIOR to your ablation. Cardiac computed tomography (CT) is a painless test that uses an x-ray machine to take clear, detailed pictures of your heart. We will contact you if the result is abnormal. We will call you to schedule.  Your physician has recommended that you have a repeat ablation. Catheter ablation is a medical procedure used to treat some cardiac arrhythmias (irregular heartbeats). During catheter ablation, a long, thin, flexible tube is put into a blood vessel in your groin (upper thigh), or neck. This tube is called an ablation catheter. It is then guided to your heart through the blood vessel. Radio frequency waves destroy small areas of heart tissue where abnormal heartbeats may cause an arrhythmia to start.   Your ablation is scheduled for  01/23/2024. Please arrive at [facility] at 5:30 am.  We will call you for further instructions.   Follow-Up: At [facility], you and your health needs are our priority.  As part of our continuing mission to provide you with exceptional heart care, we have created designated Provider Care Teams.  These Care Teams include your primary Cardiologist (physician) and Advanced Practice Providers (APPs -  Physician Assistants and Nurse Practitioners) who all work together to provide you with the care you need, when you need it.  Your next appointment:   1 month(s) after your ablation  The format for your next appointment:   In Person  Provider:   AFib clinic   Thank you for choosing [facility]!!   Dory Horn, RN [phone]    Other Instructions   Cardiac Ablation Cardiac ablation is a procedure to destroy (ablate) some heart tissue that is sending bad signals. These bad signals cause problems in heart rhythm. The heart has many areas that make these signals. If there are problems in these areas, they can make the heart beat in a way that is not normal. Destroying some tissues can help make the heart rhythm normal. Tell your doctor about: Any allergies you have. All medicines you are taking. These include vitamins, herbs, eye drops, creams, and over-the-counter medicines. Any problems you or family members have had with medicines that make you fall asleep (anesthetics). Any blood disorders you have. Any surgeries you have had. Any medical conditions you have, such as kidney failure. Whether you are  pregnant or may be pregnant. What are the risks? This is a safe procedure. But problems may occur, including: Infection. Bruising and bleeding. Bleeding into the chest. Stroke or blood clots. Damage to nearby areas of your body. Allergies to medicines or dyes. The need for a pacemaker if the normal system is damaged. Failure of the procedure to treat the  problem. What happens before the procedure? Medicines Ask your doctor about: Changing or stopping your normal medicines. This is important. Taking aspirin and ibuprofen. Do not take these medicines unless your doctor tells you to take them. Taking other medicines, vitamins, herbs, and supplements. General instructions Follow instructions from your doctor about what you cannot eat or drink. Plan to have someone take you home from the hospital or clinic. If you will be going home right after the procedure, plan to have someone with you for 24 hours. Ask your doctor what steps will be taken to prevent infection. What happens during the procedure?  An IV tube will be put into one of your veins. You will be given a medicine to help you relax. The skin on your neck or groin will be numbed. A cut (incision) will be made in your neck or groin. A needle will be put through your cut and into a large vein. A tube (catheter) will be put into the needle. The tube will be moved to your heart. Dye may be put through the tube. This helps your doctor see your heart. Small devices (electrodes) on the tube will send out signals. A type of energy will be used to destroy some heart tissue. The tube will be taken out. Pressure will be held on your cut. This helps stop bleeding. A bandage will be put over your cut. The exact procedure may vary among doctors and hospitals. What happens after the procedure? You will be watched until you leave the hospital or clinic. This includes checking your heart rate, breathing rate, oxygen, and blood pressure. Your cut will be watched for bleeding. You will need to lie still for a few hours. Do not drive for 24 hours or as long as your doctor tells you. Summary Cardiac ablation is a procedure to destroy some heart tissue. This is done to treat heart rhythm problems. Tell your doctor about any medical conditions you may have. Tell him or her about all medicines you are  taking to treat them. This is a safe procedure. But problems may occur. These include infection, bruising, bleeding, and damage to nearby areas of your body. Follow what your doctor tells you about food and drink. You may also be told to change or stop some of your medicines. After the procedure, do not drive for 24 hours or as long as your doctor tells you. This information is not intended to replace advice given to you by your health care provider. Make sure you discuss any questions you have with your health care provider. Document Revised: 11/10/2021 Document Reviewed: 07/23/2019 Elsevier Patient Education  2023 Elsevier Inc.   Cardiac Ablation, Care After  This sheet gives you information about how to care for yourself after your procedure. Your health care provider may also give you more specific instructions. If you have problems or questions, contact your health care provider. What can I expect after the procedure? After the procedure, it is common to have: Bruising around your puncture site. Tenderness around your puncture site. Skipped heartbeats. If you had an atrial fibrillation ablation, you may have atrial fibrillation during the  first several months after your procedure.  Tiredness (fatigue).  Follow these instructions at home: Puncture site care  Follow instructions from your health care provider about how to take care of your puncture site. Make sure you: If present, leave stitches (sutures), skin glue, or adhesive strips in place. These skin closures may need to stay in place for up to 2 weeks. If adhesive strip edges start to loosen and curl up, you may trim the loose edges. Do not remove adhesive strips completely unless your health care provider tells you to do that. If a large square bandage is present, this may be removed 24 hours after surgery.  Check your puncture site every day for signs of infection. Check for: Redness, swelling, or pain. Fluid or blood. If your  puncture site starts to bleed, lie down on your back, apply firm pressure to the area, and contact your health care provider. Warmth. Pus or a bad smell. A pea or small marble sized lump at the site is normal and can take up to three months to resolve.  Driving Do not drive for at least 4 days after your procedure or however long your health care provider recommends. (Do not resume driving if you have previously been instructed not to drive for other health reasons.) Do not drive or use heavy machinery while taking prescription pain medicine. Activity Avoid activities that take a lot of effort for at least 7 days after your procedure. Do not lift anything that is heavier than 5 lb (4.5 kg) for one week.  No sexual activity for 1 week.  Return to your normal activities as told by your health care provider. Ask your health care provider what activities are safe for you. General instructions Take over-the-counter and prescription medicines only as told by your health care provider. Do not use any products that contain nicotine or tobacco, such as cigarettes and e-cigarettes. If you need help quitting, ask your health care provider. You may shower after 24 hours, but Do not take baths, swim, or use a hot tub for 1 week.  Do not drink alcohol for 24 hours after your procedure. Keep all follow-up visits as told by your health care provider. This is important. Contact a health care provider if: You have redness, mild swelling, or pain around your puncture site. You have fluid or blood coming from your puncture site that stops after applying firm pressure to the area. Your puncture site feels warm to the touch. You have pus or a bad smell coming from your puncture site. You have a fever. You have chest pain or discomfort that spreads to your neck, jaw, or arm. You have chest pain that is worse with lying on your back or taking a deep breath. You are sweating a lot. You feel nauseous. You have a  fast or irregular heartbeat. You have shortness of breath. You are dizzy or light-headed and feel the need to lie down. You have pain or numbness in the arm or leg closest to your puncture site. Get help right away if: Your puncture site suddenly swells. Your puncture site is bleeding and the bleeding does not stop after applying firm pressure to the area. These symptoms may represent a serious problem that is an emergency. Do not wait to see if the symptoms will go away. Get medical help right away. Call your local emergency services (911 in the [place] Do not drive yourself to the hospital. Summary After the procedure, it is normal to have  bruising and tenderness at the puncture site in your groin, neck, or forearm. Check your puncture site every day for signs of infection. Get help right away if your puncture site is bleeding and the bleeding does not stop after applying firm pressure to the area. This is a medical emergency. This information is not intended to replace advice given to you by your health care provider. Make sure you discuss any questions you have with your health care provider.

## 2023-10-22 ENCOUNTER — Encounter: Payer: Self-pay | Admitting: Cardiology

## 2023-10-23 DIAGNOSIS — Z96652 Presence of left artificial knee joint: Secondary | ICD-10-CM | POA: Diagnosis not present

## 2023-10-23 DIAGNOSIS — M25461 Effusion, right knee: Secondary | ICD-10-CM | POA: Diagnosis not present

## 2023-10-23 DIAGNOSIS — Z96651 Presence of right artificial knee joint: Secondary | ICD-10-CM | POA: Diagnosis not present

## 2023-10-23 DIAGNOSIS — M7651 Patellar tendinitis, right knee: Secondary | ICD-10-CM | POA: Diagnosis not present

## 2023-10-25 ENCOUNTER — Ambulatory Visit (HOSPITAL_COMMUNITY): Payer: BC Managed Care – PPO | Admitting: Internal Medicine

## 2023-10-25 ENCOUNTER — Other Ambulatory Visit (HOSPITAL_COMMUNITY): Payer: Self-pay

## 2023-10-25 DIAGNOSIS — Z4889 Encounter for other specified surgical aftercare: Secondary | ICD-10-CM | POA: Diagnosis not present

## 2023-10-25 MED ORDER — RIVAROXABAN 20 MG PO TABS: 20.0000 mg | ORAL_TABLET | Freq: Every day | ORAL | Status: DC

## 2023-10-31 ENCOUNTER — Ambulatory Visit (HOSPITAL_COMMUNITY)
Admission: RE | Admit: 2023-10-31 | Discharge: 2023-10-31 | Disposition: A | Discharge status: Home / Self Care | Payer: BC Managed Care – PPO | Attending: Cardiology | Admitting: Cardiology

## 2023-10-31 ENCOUNTER — Ambulatory Visit (HOSPITAL_COMMUNITY): Payer: BC Managed Care – PPO | Admitting: Certified Registered Nurse Anesthetist

## 2023-10-31 ENCOUNTER — Other Ambulatory Visit: Payer: Self-pay

## 2023-10-31 ENCOUNTER — Encounter (HOSPITAL_COMMUNITY): Payer: Self-pay | Admitting: Cardiology

## 2023-10-31 ENCOUNTER — Encounter (HOSPITAL_COMMUNITY)
Admission: RE | Disposition: A | Discharge status: Home / Self Care | Payer: BC Managed Care – PPO | Source: Home / Self Care | Attending: Cardiology

## 2023-10-31 DIAGNOSIS — M1990 Unspecified osteoarthritis, unspecified site: Secondary | ICD-10-CM | POA: Insufficient documentation

## 2023-10-31 DIAGNOSIS — Z79899 Other long term (current) drug therapy: Secondary | ICD-10-CM | POA: Diagnosis not present

## 2023-10-31 DIAGNOSIS — Z7901 Long term (current) use of anticoagulants: Secondary | ICD-10-CM | POA: Insufficient documentation

## 2023-10-31 DIAGNOSIS — D6869 Other thrombophilia: Secondary | ICD-10-CM | POA: Insufficient documentation

## 2023-10-31 DIAGNOSIS — G4733 Obstructive sleep apnea (adult) (pediatric): Secondary | ICD-10-CM | POA: Diagnosis not present

## 2023-10-31 DIAGNOSIS — I480 Paroxysmal atrial fibrillation: Secondary | ICD-10-CM

## 2023-10-31 DIAGNOSIS — I10 Essential (primary) hypertension: Secondary | ICD-10-CM | POA: Insufficient documentation

## 2023-10-31 DIAGNOSIS — I4819 Other persistent atrial fibrillation: Secondary | ICD-10-CM | POA: Diagnosis not present

## 2023-10-31 DIAGNOSIS — K219 Gastro-esophageal reflux disease without esophagitis: Secondary | ICD-10-CM | POA: Diagnosis not present

## 2023-10-31 DIAGNOSIS — I4891 Unspecified atrial fibrillation: Secondary | ICD-10-CM

## 2023-10-31 DIAGNOSIS — I2510 Atherosclerotic heart disease of native coronary artery without angina pectoris: Secondary | ICD-10-CM | POA: Diagnosis not present

## 2023-10-31 HISTORY — PX: CARDIOVERSION: EP1203

## 2023-10-31 LAB — POCT I-STAT, CHEM 8
BUN: 21 mg/dL (ref 8–23)
Calcium, Ion: 1.22 mmol/L (ref 1.15–1.40)
Chloride: 103 mmol/L (ref 98–111)
Creatinine, Ser: 1.6 mg/dL — ABNORMAL HIGH (ref 0.61–1.24)
Glucose, Bld: 129 mg/dL — ABNORMAL HIGH (ref 70–99)
HCT: 42 % (ref 39.0–52.0)
Hemoglobin: 14.3 g/dL (ref 13.0–17.0)
Potassium: 3.7 mmol/L (ref 3.5–5.1)
Sodium: 141 mmol/L (ref 135–145)
TCO2: 25 mmol/L (ref 22–32)

## 2023-10-31 SURGERY — CARDIOVERSION (CATH LAB)
Anesthesia: General

## 2023-10-31 MED ORDER — LIDOCAINE 2% (20 MG/ML) 5 ML SYRINGE
INTRAMUSCULAR | Status: DC | PRN
  Administered 2023-10-31: 40 mg via INTRAVENOUS

## 2023-10-31 MED ORDER — PROPOFOL 10 MG/ML IV BOLUS
INTRAVENOUS | Status: DC | PRN
  Administered 2023-10-31: 60 mg via INTRAVENOUS
  Administered 2023-10-31: 40 mg via INTRAVENOUS

## 2023-10-31 SURGICAL SUPPLY — 1 items: PAD DEFIB RADIO PHYSIO CONN (PAD) ×1 IMPLANT

## 2023-10-31 NOTE — Interval H&P Note (Signed)
 History and Physical Interval Note:  10/31/2023 9:34 AM  Austin Barker  has presented today for surgery, with the diagnosis of AFIB.  The various methods of treatment have been discussed with the patient and family. After consideration of risks, benefits and other options for treatment, the patient has consented to  Procedure(s): CARDIOVERSION (N/A) as a surgical intervention.  The patient's history has been reviewed, patient examined, no change in status, stable for surgery.  I have reviewed the patient's chart and labs.  Questions were answered to the patient's satisfaction.     [employer]

## 2023-10-31 NOTE — Transfer of Care (Signed)
 Immediate Anesthesia Transfer of Care Note  Patient: Austin Barker  Procedure(s) Performed: CARDIOVERSION  Patient Location: Cath Lab  Anesthesia Type:General  Level of Consciousness: awake, alert , and oriented  Airway & Oxygen Therapy: Patient Spontanous Breathing and Patient connected to nasal cannula oxygen  Post-op Assessment: Report given to RN, Post -op Vital signs reviewed and stable, and Patient moving all extremities X 4  Post vital signs: Reviewed and stable  Last Vitals:  Vitals Value Taken Time  BP 144/101 10/31/23 1005  Temp    Pulse 77 10/31/23 1007  Resp 11 10/31/23 1006  SpO2 99 % 10/31/23 1007  Vitals shown include unfiled device data.  Last Pain:  Vitals:   10/31/23 0852  TempSrc: Temporal         Complications: No notable events documented.

## 2023-10-31 NOTE — Anesthesia Postprocedure Evaluation (Signed)
 Anesthesia Post Note  Patient: Austin Barker  Procedure(s) Performed: CARDIOVERSION     Patient location during evaluation: PACU Anesthesia Type: General Level of consciousness: awake and alert Pain management: pain level controlled Vital Signs Assessment: post-procedure vital signs reviewed and stable Respiratory status: spontaneous breathing, nonlabored ventilation, respiratory function stable and patient connected to nasal cannula oxygen Cardiovascular status: blood pressure returned to baseline and stable Postop Assessment: no apparent nausea or vomiting Anesthetic complications: no  No notable events documented.  Last Vitals:  Vitals:   10/31/23 1020 10/31/23 1025  BP: 120/82 128/85  Pulse: 70 66  Resp: 15 12  Temp:  36.7 C  SpO2: 96% 94%    Last Pain:  Vitals:   10/31/23 1025  TempSrc: Temporal                 Shelton Silvas

## 2023-10-31 NOTE — CV Procedure (Signed)
    Electrical Cardioversion Procedure Note Austin Barker [id number] [birth year]  Procedure: Electrical Cardioversion Indications:  Atrial Fibrillation  Time Out: Verified patient identification, verified procedure,medications/allergies/relevent history reviewed, required imaging and test results available.  Performed  Procedure Details  The patient was NPO after midnight. Anesthesia was administered at the beside  by Dr.Hollis with 100mg  of propofol.  Cardioversion was performed with synchronized biphasic defibrillation via AP pads with 200, 300, 360 joules.  3 attempt(s) were performed (last of which with chest wall compression).  The patient converted to normal sinus rhythm. The patient tolerated the procedure well   IMPRESSION:  Successful cardioversion of atrial fibrillation    Austin Barker 10/31/2023, 10:05 AM

## 2023-10-31 NOTE — Anesthesia Preprocedure Evaluation (Addendum)
 Anesthesia Evaluation  Patient identified by MRN, date of birth, ID band Patient awake    Reviewed: Allergy & Precautions, NPO status , Patient's Chart, lab work & pertinent test results, reviewed documented beta blocker date and time   Airway Mallampati: II  TM Distance: >3 FB Neck ROM: Full    Dental  (+) Teeth Intact, Dental Advisory Given   Pulmonary sleep apnea    breath sounds clear to auscultation       Cardiovascular hypertension, Pt. on home beta blockers and Pt. on medications + CAD  + dysrhythmias Atrial Fibrillation  Rhythm:Irregular Rate:Abnormal  Nuc. Stress Test  Nuclear stress EF: 61%. The left ventricular ejection fraction is normal (55-65%).  There was no ST segment deviation noted during stress.  This is a low risk study. There is no evidence of ischemia and no evidence of previous infarction  The study is normal.    Neuro/Psych negative neurological ROS  negative psych ROS   GI/Hepatic Neg liver ROS,GERD  Medicated,,  Endo/Other  negative endocrine ROS    Renal/GU negative Renal ROS     Musculoskeletal  (+) Arthritis ,    Abdominal   Peds  Hematology   Anesthesia Other Findings   Reproductive/Obstetrics                             Anesthesia Physical Anesthesia Plan  ASA: 3  Anesthesia Plan: General   Post-op Pain Management: Minimal or no pain anticipated   Induction: Intravenous  PONV Risk Score and Plan: 0 and Propofol infusion  Airway Management Planned: Natural Airway and Nasal Cannula  Additional Equipment: None  Intra-op Plan:   Post-operative Plan:   Informed Consent: I have reviewed the patients History and Physical, chart, labs and discussed the procedure including the risks, benefits and alternatives for the proposed anesthesia with the patient or authorized representative who has indicated his/her understanding and acceptance.        Plan Discussed with: CRNA  Anesthesia Plan Comments:        Anesthesia Quick Evaluation

## 2023-11-01 ENCOUNTER — Ambulatory Visit (HOSPITAL_COMMUNITY): Payer: BC Managed Care – PPO | Admitting: Internal Medicine

## 2023-12-09 ENCOUNTER — Telehealth: Payer: Self-pay | Admitting: Cardiology

## 2023-12-09 MED ORDER — AMIODARONE HCL 200 MG PO TABS: 200.0000 mg | ORAL_TABLET | Freq: Two times a day (BID) | ORAL | 3 refills | Status: DC

## 2023-12-09 NOTE — Telephone Encounter (Signed)
 Pt's medication was sent to pt's pharmacy as requested. Confirmation received.

## 2023-12-09 NOTE — Telephone Encounter (Signed)
*  STAT* If patient is at the pharmacy, call can be transferred to refill team.   1. Which medications need to be refilled? (please list name of each medication and dose if known) amiodarone (PACERONE) 200 MG tablet    2. Would you like to learn more about the convenience, safety, & potential cost savings by using the [facility]? No   3. Are you open to using the [facility] (Type [facility].) No   4. Which pharmacy/location (including street and city if local pharmacy) is medication to be sent to?  [facility] [address] [place], [place], [place] [address] [phone]   5. Do they need a 30 day or 90 day supply? 90 day  Pt is out of medication

## 2023-12-25 ENCOUNTER — Telehealth: Payer: Self-pay | Admitting: Cardiology

## 2023-12-25 NOTE — Telephone Encounter (Signed)
 Patient stated he had a cardioversion and has been in rhythm ever since.  Patient wants to know if he will still need to have the upcoming cath procedure.

## 2023-12-25 NOTE — Telephone Encounter (Signed)
 Spoke with patient and he states since cardioversion he has been in NSR. He would like to know if he stay in NSR will he need to do the ablation?

## 2023-12-26 NOTE — Telephone Encounter (Signed)
 Left detailed message (DPR on file) informing pt that yes he should still proceed with planned ablation on 5/22.  Asked pt to call back if he wanted to discuss this further.

## 2024-01-02 ENCOUNTER — Telehealth (HOSPITAL_COMMUNITY): Payer: Self-pay | Admitting: *Deleted

## 2024-01-02 NOTE — Telephone Encounter (Signed)
 Reaching out to patient to offer assistance regarding upcoming cardiac imaging study; pt verbalizes understanding of appt date/time, parking situation and where to check in, pre-test NPO status and verified current allergies; name and call back number provided for further questions should they arise  Chase Copping RN Navigator Cardiac Imaging [facility] [phone] office [phone] cell  Patient to take his daily medications and is aware to arrive at 12 PM.

## 2024-01-03 ENCOUNTER — Ambulatory Visit (HOSPITAL_COMMUNITY): Payer: BC Managed Care – PPO

## 2024-01-03 ENCOUNTER — Ambulatory Visit (HOSPITAL_COMMUNITY)
Admission: RE | Admit: 2024-01-03 | Discharge: 2024-01-03 | Disposition: A | Discharge status: Home / Self Care | Source: Ambulatory Visit | Attending: Cardiology

## 2024-01-03 DIAGNOSIS — I4819 Other persistent atrial fibrillation: Secondary | ICD-10-CM | POA: Diagnosis not present

## 2024-01-03 MED ORDER — IOHEXOL 350 MG/ML SOLN
100.0000 mL | Freq: Once | INTRAVENOUS | Status: AC | PRN
  Administered 2024-01-03: 100 mL via INTRAVENOUS

## 2024-01-13 ENCOUNTER — Telehealth: Payer: Self-pay | Admitting: Cardiology

## 2024-01-13 MED ORDER — RIVAROXABAN 20 MG PO TABS: 20.0000 mg | ORAL_TABLET | Freq: Every day | ORAL | 5 refills | Status: DC

## 2024-01-13 NOTE — Telephone Encounter (Signed)
 *  STAT* If patient is at the pharmacy, call can be transferred to refill team.   1. Which medications need to be refilled? (please list name of each medication and dose if known)   rivaroxaban  (XARELTO ) 20 MG TABS tablet   2. Which pharmacy/location (including street and city if local pharmacy) is medication to be sent to?  [id number] - [place], [place] - [address]    3. Do they need a 30 day or 90 day supply? 30

## 2024-01-13 NOTE — Telephone Encounter (Signed)
 Pt last saw Dr Lawana Pray 10/21/23, last labs 08/30/23 Creat 1.36, age 63, weight 133.8kg, CrCl 105.21, based on CrCl pt is on appropriate dosage of Xarelto  20mg  every day for afib.  Will refill rx.

## 2024-01-16 ENCOUNTER — Telehealth (HOSPITAL_COMMUNITY): Payer: Self-pay

## 2024-01-16 NOTE — Telephone Encounter (Signed)
 Notified Dr. Lawana Pray of calcium  score of 2080 per cardiac CT (was 636 Jan 2020). Advised, if no chest pain can proceed. Patient will need a general cardiologist. Cindie Credit., RN to help arrange referral.

## 2024-01-16 NOTE — Telephone Encounter (Signed)
 Attempted to reach patient to discuss upcoming procedure, no answer. Left VM for patient to return call.

## 2024-01-22 NOTE — Anesthesia Preprocedure Evaluation (Addendum)
 Anesthesia Evaluation  Patient identified by MRN, date of birth, ID band Patient awake    Reviewed: Allergy & Precautions, NPO status , Patient's Chart, lab work & pertinent test results, reviewed documented beta blocker date and time   Airway Mallampati: III  TM Distance: >3 FB Neck ROM: Full    Dental  (+) Teeth Intact, Dental Advisory Given, Caps,    Pulmonary sleep apnea and Continuous Positive Airway Pressure Ventilation    Pulmonary exam normal breath sounds clear to auscultation       Cardiovascular hypertension, Pt. on medications and Pt. on home beta blockers (-) angina + CAD  (-) Past MI Normal cardiovascular exam+ dysrhythmias Atrial Fibrillation  Rhythm:Regular Rate:Normal     Neuro/Psych negative neurological ROS     GI/Hepatic Neg liver ROS,GERD  Medicated,,  Endo/Other  Obesity   Renal/GU Renal InsufficiencyRenal disease     Musculoskeletal  (+) Arthritis ,    Abdominal   Peds  Hematology  (+) Blood dyscrasia (Xarelto )   Anesthesia Other Findings   Reproductive/Obstetrics                             Anesthesia Physical Anesthesia Plan  ASA: 3  Anesthesia Plan: General   Post-op Pain Management: Tylenol  PO (pre-op )*   Induction: Intravenous  PONV Risk Score and Plan: 2 and Midazolam , Dexamethasone  and Ondansetron   Airway Management Planned: Oral ETT  Additional Equipment:   Intra-op Plan:   Post-operative Plan: Extubation in OR  Informed Consent: I have reviewed the patients History and Physical, chart, labs and discussed the procedure including the risks, benefits and alternatives for the proposed anesthesia with the patient or authorized representative who has indicated his/her understanding and acceptance.     Dental advisory given  Plan Discussed with: CRNA  Anesthesia Plan Comments:         Anesthesia Quick Evaluation

## 2024-01-22 NOTE — Pre-Procedure Instructions (Signed)
 Instructed patient on the following items: Arrival time 0515 Nothing to eat or drink after midnight No meds AM of procedure Responsible person to drive you home and stay with you for 24 hrs  Have you missed any doses of anti-coagulant Xarelto - takes one a day, hasn't missed any doses in last 4 weeks

## 2024-01-23 ENCOUNTER — Ambulatory Visit (HOSPITAL_COMMUNITY)
Admission: RE | Disposition: A | Discharge status: Home / Self Care | Payer: Self-pay | Source: Home / Self Care | Attending: Cardiology

## 2024-01-23 ENCOUNTER — Other Ambulatory Visit: Payer: Self-pay

## 2024-01-23 ENCOUNTER — Ambulatory Visit (HOSPITAL_COMMUNITY): Admitting: Anesthesiology

## 2024-01-23 ENCOUNTER — Ambulatory Visit (HOSPITAL_COMMUNITY)
Admission: RE | Admit: 2024-01-23 | Discharge: 2024-01-23 | Disposition: A | Discharge status: Home / Self Care | Payer: BC Managed Care – PPO | Attending: Cardiology | Admitting: Cardiology

## 2024-01-23 DIAGNOSIS — G4730 Sleep apnea, unspecified: Secondary | ICD-10-CM | POA: Diagnosis not present

## 2024-01-23 DIAGNOSIS — K219 Gastro-esophageal reflux disease without esophagitis: Secondary | ICD-10-CM | POA: Insufficient documentation

## 2024-01-23 DIAGNOSIS — G4733 Obstructive sleep apnea (adult) (pediatric): Secondary | ICD-10-CM | POA: Diagnosis not present

## 2024-01-23 DIAGNOSIS — Z7901 Long term (current) use of anticoagulants: Secondary | ICD-10-CM | POA: Diagnosis not present

## 2024-01-23 DIAGNOSIS — I10 Essential (primary) hypertension: Secondary | ICD-10-CM | POA: Insufficient documentation

## 2024-01-23 DIAGNOSIS — I4819 Other persistent atrial fibrillation: Secondary | ICD-10-CM | POA: Diagnosis not present

## 2024-01-23 DIAGNOSIS — I4891 Unspecified atrial fibrillation: Secondary | ICD-10-CM | POA: Diagnosis not present

## 2024-01-23 DIAGNOSIS — I2510 Atherosclerotic heart disease of native coronary artery without angina pectoris: Secondary | ICD-10-CM | POA: Diagnosis not present

## 2024-01-23 HISTORY — PX: ATRIAL FIBRILLATION ABLATION: EP1191

## 2024-01-23 LAB — BASIC METABOLIC PANEL WITH GFR
Anion gap: 8 (ref 5–15)
BUN: 17 mg/dL (ref 8–23)
CO2: 26 mmol/L (ref 22–32)
Calcium: 9.4 mg/dL (ref 8.9–10.3)
Chloride: 105 mmol/L (ref 98–111)
Creatinine, Ser: 1.36 mg/dL — ABNORMAL HIGH (ref 0.61–1.24)
GFR, Estimated: 58 mL/min — ABNORMAL LOW (ref >60)
Glucose, Bld: 118 mg/dL — ABNORMAL HIGH (ref 70–99)
Potassium: 3.7 mmol/L (ref 3.5–5.1)
Sodium: 139 mmol/L (ref 135–145)

## 2024-01-23 LAB — CBC
HCT: 42.6 % (ref 39.0–52.0)
Hemoglobin: 14.7 g/dL (ref 13.0–17.0)
MCH: 32 pg (ref 26.0–34.0)
MCHC: 34.5 g/dL (ref 30.0–36.0)
MCV: 92.6 fL (ref 80.0–100.0)
Platelets: 213 10*3/uL (ref 150–400)
RBC: 4.6 MIL/uL (ref 4.22–5.81)
RDW: 13.9 % (ref 11.5–15.5)
WBC: 7 10*3/uL (ref 4.0–10.5)
nRBC: 0 % (ref 0.0–0.2)

## 2024-01-23 LAB — POCT ACTIVATED CLOTTING TIME: Activated Clotting Time: 314 s

## 2024-01-23 SURGERY — ATRIAL FIBRILLATION ABLATION
Anesthesia: General

## 2024-01-23 MED ORDER — MIDAZOLAM HCL 2 MG/2ML IJ SOLN
INTRAMUSCULAR | Status: AC
  Filled 2024-01-23: qty 2

## 2024-01-23 MED ORDER — PROTAMINE SULFATE 10 MG/ML IV SOLN
INTRAVENOUS | Status: DC | PRN
  Administered 2024-01-23: 40 mg via INTRAVENOUS

## 2024-01-23 MED ORDER — HEPARIN SODIUM (PORCINE) 1000 UNIT/ML IJ SOLN
INTRAMUSCULAR | Status: DC | PRN
  Administered 2024-01-23: 15000 [IU] via INTRAVENOUS
  Administered 2024-01-23: 2000 [IU] via INTRAVENOUS

## 2024-01-23 MED ORDER — PROPOFOL 10 MG/ML IV BOLUS
INTRAVENOUS | Status: DC | PRN
  Administered 2024-01-23: 150 mg via INTRAVENOUS

## 2024-01-23 MED ORDER — FENTANYL CITRATE (PF) 100 MCG/2ML IJ SOLN
INTRAMUSCULAR | Status: AC
  Filled 2024-01-23: qty 2

## 2024-01-23 MED ORDER — ATROPINE SULFATE 1 MG/ML IV SOLN
INTRAVENOUS | Status: DC | PRN
Start: 2024-01-23 — End: 2024-01-23
  Administered 2024-01-23: 1 mg via INTRAVENOUS

## 2024-01-23 MED ORDER — ONDANSETRON HCL 4 MG/2ML IJ SOLN: 4.0000 mg | Freq: Four times a day (QID) | INTRAMUSCULAR | Status: DC | PRN

## 2024-01-23 MED ORDER — ACETAMINOPHEN 325 MG PO TABS: 650.0000 mg | ORAL_TABLET | ORAL | Status: DC | PRN

## 2024-01-23 MED ORDER — ATROPINE SULFATE 1 MG/10ML IJ SOSY
PREFILLED_SYRINGE | INTRAMUSCULAR | Status: AC
  Filled 2024-01-23: qty 10

## 2024-01-23 MED ORDER — SODIUM CHLORIDE 0.9% FLUSH: 3.0000 mL | Freq: Two times a day (BID) | INTRAVENOUS | Status: DC

## 2024-01-23 MED ORDER — HEPARIN (PORCINE) IN NACL 1000-0.9 UT/500ML-% IV SOLN
INTRAVENOUS | Status: DC | PRN
  Administered 2024-01-23 (×3): 500 mL

## 2024-01-23 MED ORDER — SUGAMMADEX SODIUM 200 MG/2ML IV SOLN
INTRAVENOUS | Status: DC | PRN
  Administered 2024-01-23: 100 mg via INTRAVENOUS
  Administered 2024-01-23: 200 mg via INTRAVENOUS

## 2024-01-23 MED ORDER — SODIUM CHLORIDE 0.9 % IV SOLN: 250.0000 mL | INTRAVENOUS | Status: DC | PRN

## 2024-01-23 MED ORDER — ONDANSETRON HCL 4 MG/2ML IJ SOLN
INTRAMUSCULAR | Status: DC | PRN
  Administered 2024-01-23: 4 mg via INTRAVENOUS

## 2024-01-23 MED ORDER — ACETAMINOPHEN 500 MG PO TABS
1000.0000 mg | ORAL_TABLET | Freq: Once | ORAL | Status: AC
  Administered 2024-01-23: 1000 mg via ORAL
  Filled 2024-01-23: qty 2

## 2024-01-23 MED ORDER — DEXAMETHASONE SODIUM PHOSPHATE 10 MG/ML IJ SOLN
INTRAMUSCULAR | Status: DC | PRN
  Administered 2024-01-23: 10 mg via INTRAVENOUS

## 2024-01-23 MED ORDER — SODIUM CHLORIDE 0.9% FLUSH: 3.0000 mL | INTRAVENOUS | Status: DC | PRN

## 2024-01-23 MED ORDER — MIDAZOLAM HCL 2 MG/2ML IJ SOLN
INTRAMUSCULAR | Status: DC | PRN
  Administered 2024-01-23: 2 mg via INTRAVENOUS

## 2024-01-23 MED ORDER — LIDOCAINE 2% (20 MG/ML) 5 ML SYRINGE
INTRAMUSCULAR | Status: DC | PRN
  Administered 2024-01-23: 100 mg via INTRAVENOUS

## 2024-01-23 MED ORDER — FENTANYL CITRATE (PF) 250 MCG/5ML IJ SOLN
INTRAMUSCULAR | Status: DC | PRN
Start: 2024-01-23 — End: 2024-01-23
  Administered 2024-01-23: 50 ug via INTRAVENOUS

## 2024-01-23 MED ORDER — SODIUM CHLORIDE 0.9 % IV SOLN: INTRAVENOUS | Status: DC

## 2024-01-23 MED ORDER — ROCURONIUM BROMIDE 10 MG/ML (PF) SYRINGE
PREFILLED_SYRINGE | INTRAVENOUS | Status: DC | PRN
  Administered 2024-01-23: 70 mg via INTRAVENOUS
  Administered 2024-01-23: 30 mg via INTRAVENOUS

## 2024-01-23 MED ORDER — PHENYLEPHRINE HCL-NACL 20-0.9 MG/250ML-% IV SOLN
INTRAVENOUS | Status: DC | PRN
  Administered 2024-01-23: 50 ug/min via INTRAVENOUS

## 2024-01-23 NOTE — Anesthesia Procedure Notes (Signed)
 Procedure Name: Intubation Date/Time: 01/23/2024 7:44 AM  Performed by: Johann Muta, CRNAPre-anesthesia Checklist: Patient identified, Emergency Drugs available, Suction available and Patient being monitored Patient Re-evaluated:Patient Re-evaluated prior to induction Oxygen  Delivery Method: Circle System Utilized Preoxygenation: Pre-oxygenation with 100% oxygen  Induction Type: IV induction Ventilation: Mask ventilation without difficulty Laryngoscope Size: Glidescope and 4 Grade View: Grade II Tube type: Oral Number of attempts: 1 Airway Equipment and Method: Stylet and Oral airway Placement Confirmation: ETT inserted through vocal cords under direct vision, positive ETCO2 and breath sounds checked- equal and bilateral Secured at: 24 cm Tube secured with: Tape Dental Injury: Teeth and Oropharynx as per pre-operative assessment

## 2024-01-23 NOTE — Transfer of Care (Signed)
 Immediate Anesthesia Transfer of Care Note  Patient: Austin Barker  Procedure(s) Performed: ATRIAL FIBRILLATION ABLATION  Patient Location: Cath Lab  Anesthesia Type:General  Level of Consciousness: awake and alert   Airway & Oxygen  Therapy: Patient Spontanous Breathing and Patient connected to nasal cannula oxygen   Post-op Assessment: Report given to RN and Post -op Vital signs reviewed and stable  Post vital signs: Reviewed and stable  Last Vitals:  Vitals Value Taken Time  BP 139/86 01/23/24 0850  Temp    Pulse 72 01/23/24 0854  Resp 17 01/23/24 0854  SpO2 96 % 01/23/24 0854  Vitals shown include unfiled device data.  Last Pain:  Vitals:   01/23/24 0849  TempSrc: Tympanic  PainSc: 0-No pain         Complications: There were no known notable events for this encounter.

## 2024-01-23 NOTE — Discharge Instructions (Signed)

## 2024-01-23 NOTE — Progress Notes (Signed)
  Pt arrived from EP via Bed to HA 20. Report received from RN and CRNA. Pt vitals are stable, performed q49min see vitals flowsheet. Anesthesia recovery has been uneventful. Pt to transition to short stay for remainder of recovery. Will continue to monitor patient while under holding area care.

## 2024-01-23 NOTE — Anesthesia Postprocedure Evaluation (Signed)
 Anesthesia Post Note  Patient: Austin Barker  Procedure(s) Performed: ATRIAL FIBRILLATION ABLATION     Patient location during evaluation: PACU Anesthesia Type: General Level of consciousness: awake and alert Pain management: pain level controlled Vital Signs Assessment: post-procedure vital signs reviewed and stable Respiratory status: spontaneous breathing, nonlabored ventilation and respiratory function stable Cardiovascular status: blood pressure returned to baseline and stable Postop Assessment: no apparent nausea or vomiting Anesthetic complications: no   There were no known notable events for this encounter.  Last Vitals:  Vitals:   01/23/24 1000 01/23/24 1015  BP: 135/83 131/76  Pulse: 75 72  Resp: 12 13  Temp:    SpO2: 95% 94%    Last Pain:  Vitals:   01/23/24 0930  TempSrc:   PainSc: 0-No pain   Pain Goal:                   Erin Havers

## 2024-01-23 NOTE — H&P (Signed)
  Electrophysiology Office Note:   Date:  01/23/2024  ID:  Ahnaf, Caponi [birth year], MRN [id number]  Primary Cardiologist: None Primary Heart Failure: None Electrophysiologist: Gearold Wainer Cortland Ding, MD      History of Present Illness:   Austin Barker is a 63 y.o. male with h/o sleep apnea, hypertension, hyperlipidemia, obesity, atrial fibrillation seen today for routine electrophysiology followup.   He is post ablation in 2020.  He presented to A-fib clinic in atrial fibrillation.  He is now post cardioversion on 09/20/2023.  Unfortunately, he has gone back into atrial fibrillation.  He feels tired, fatigued, short of breath.  Today, denies symptoms of palpitations, chest pain, shortness of breath, orthopnea, PND, lower extremity edema, claudication, dizziness, presyncope, syncope, bleeding, or neurologic sequela. The patient is tolerating medications without difficulties. Plan ablation today.   EP Information / Studies Reviewed:    EKG is ordered today. Personal review as below.        Risk Assessment/Calculations:    CHA2DS2-VASc Score = 2   This indicates a 2.2% annual risk of stroke. The patient's score is based upon: CHF History: 0 HTN History: 1 Diabetes History: 0 Stroke History: 0 Vascular Disease History: 1 Age Score: 0 Gender Score: 0            Physical Exam:   VS:  BP (!) 142/85   Pulse 64   Temp 97.8 F (36.6 C) (Oral)   Resp 20   Ht 6\' 1"  (1.854 m)   Wt 132 kg   SpO2 97%   BMI 38.39 kg/m    Wt Readings from Last 3 Encounters:  01/23/24 132 kg  10/21/23 133.8 kg  10/04/23 (!) 141.3 kg    GEN: No acute distress.   Neck: No JVD Cardiac: RRR, no murmurs, rubs, or gallops.  Respiratory: normal BS bilaterally. GI: Soft, nontender, non-distended  MS: No edema; No deformity. Neuro:  Nonfocal  Skin: warm and dry Psych: Normal affect   ASSESSMENT AND PLAN:    1.  Persistent atrial fibrillation: [name] has  presented today for surgery, with the diagnosis of AF.  The various methods of treatment have been discussed with the patient and family. After consideration of risks, benefits and other options for treatment, the patient has consented to  Procedure(s): Catheter ablation as a surgical intervention .  Risks include but not limited to complete heart block, stroke, esophageal damage, nerve damage, bleeding, vascular damage, tamponade, perforation, MI, and death. The patient's history has been reviewed, patient examined, no change in status, stable for surgery.  I have reviewed the patient's chart and labs.  Questions were answered to the patient's satisfaction.    Wille Aubuchon Lawana Pray, MD 01/23/2024 7:08 AM

## 2024-01-24 ENCOUNTER — Encounter (HOSPITAL_COMMUNITY): Payer: Self-pay | Admitting: Cardiology

## 2024-01-24 ENCOUNTER — Telehealth (HOSPITAL_COMMUNITY): Payer: Self-pay

## 2024-01-24 MED FILL — Atropine Sulfate Soln Prefill Syr 1 MG/10ML (0.1 MG/ML): INTRAMUSCULAR | Qty: 10 | Status: AC

## 2024-01-24 MED FILL — Fentanyl Citrate Preservative Free (PF) Inj 100 MCG/2ML: INTRAMUSCULAR | Qty: 1 | Status: AC

## 2024-01-24 NOTE — Telephone Encounter (Signed)
 Attempted to reach patient to follow up with procedure completed on 01/23/24, no answer. Left VM for patient to return call.

## 2024-01-24 NOTE — Telephone Encounter (Signed)
 Spoke with patient to complete post procedure follow up call.  Patient reports no complications with groin sites.   Instructions reviewed with patient:  Remove large bandage at puncture site after 24 hours. It is normal to have bruising, tenderness and a pea or marble sized lump/knot at the groin site which can take up to three months to resolve.  Get help right away if you notice sudden swelling at the puncture site.  Check your puncture site every day for signs of infection: fever, redness, swelling, pus drainage, warmth, foul odor or excessive pain. If this occurs, please call the office at [phone], to speak with the nurse. Get help right away if your puncture site is bleeding and the bleeding does not stop after applying firm pressure to the area.  You may continue to have skipped beats/ atrial fibrillation during the first several months after your procedure.  It is very important not to miss any doses of your blood thinner Xarelto . Patient restarted taking this medication on 01/23/24.   You will follow up with the Afib clinic on 02/20/24 and follow up with the Afib clinic on 04/23/24. Patient requested both appointments to be moved out one day due to work schedule. Message sent to EP scheduling to assist with schedule change.   Patient verbalized understanding to all instructions provided.

## 2024-01-30 ENCOUNTER — Telehealth: Payer: Self-pay | Admitting: *Deleted

## 2024-01-30 ENCOUNTER — Encounter: Payer: Self-pay | Admitting: Emergency Medicine

## 2024-01-30 NOTE — Telephone Encounter (Signed)
 Called pt to follow up on CT result -- 2080 calcium  score.  Patient states that he saw that, advised that he needs to follow up with his PCP (who is following this currently).  Aware I will fax result to PCP.  Patient agreeable to plan.

## 2024-02-20 ENCOUNTER — Ambulatory Visit (HOSPITAL_COMMUNITY): Admitting: Internal Medicine

## 2024-02-21 ENCOUNTER — Ambulatory Visit (HOSPITAL_COMMUNITY)
Admission: RE | Admit: 2024-02-21 | Discharge: 2024-02-21 | Disposition: A | Discharge status: Home / Self Care | Source: Ambulatory Visit | Attending: Internal Medicine | Admitting: Internal Medicine

## 2024-02-21 VITALS — BP 160/86 | HR 67 | Ht 73.0 in | Wt 301.6 lb

## 2024-02-21 DIAGNOSIS — Z5181 Encounter for therapeutic drug level monitoring: Secondary | ICD-10-CM

## 2024-02-21 DIAGNOSIS — D6869 Other thrombophilia: Secondary | ICD-10-CM | POA: Diagnosis not present

## 2024-02-21 DIAGNOSIS — I4819 Other persistent atrial fibrillation: Secondary | ICD-10-CM

## 2024-02-21 DIAGNOSIS — I4891 Unspecified atrial fibrillation: Secondary | ICD-10-CM | POA: Diagnosis not present

## 2024-02-21 DIAGNOSIS — Z79899 Other long term (current) drug therapy: Secondary | ICD-10-CM

## 2024-02-21 MED ORDER — AMIODARONE HCL 200 MG PO TABS: 200.0000 mg | ORAL_TABLET | Freq: Every day | ORAL | 1 refills | Status: DC

## 2024-02-21 NOTE — Progress Notes (Signed)
 Primary Care Physician: Gearld Keep, MD Primary Cardiologist: Dr Victoriano Grate Primary Electrophysiologist: Dr Lawana Pray Referring Physician: Dr Pansy Bogus Austin Barker is a 63 y.o. male with a history of persistent atrial fibrillation, CAD (CAC score of 636 in 2020), OSA, HTN, HLD who presents for consultation in the [facility]rial Fibr[facility].  The patient was initially diagnosed with atrial fibrillation 05/2018 at his PCP after presenting with symptoms of fatigue and exertional dyspnea. S/p DCCV on 06/19/18 and 06/30/18 with early return of afib. Now s/p afib ablation by Dr Lawana Pray 09/12/18.   On evaluation today, he is currently in Afib. He contacted office on 12/9 noting he has been in Afib for the past couple of days. He notes acute stress due to wife having third surgery related to breast cancer and daughter going through divorce. He notes his HR was in the 130s this morning. He wears an Apple watch. Patient is not on anticoagulation.  On follow up 08/30/23, he is currently in Afib. He is on Xarelto  20 mg since last OV and missed a dose on 12/21. He feels tired and SOB when in Afib.   On follow up 10/04/23, he is currently in Afib. S/p successful DCCV on 09/20/23. He went back into Afib last Saturday 1/25. He feels tired when in Afib.   On follow up 02/21/24, patient is currently in NSR. S/p Afib ablation on 01/23/24 by Dr. Lawana Pray. No episodes of Afib since ablation. He is taking amiodarone  200 mg twice daily; he has been doing this since April. No chest pain or SOB. Leg sites healed without issue. No missed doses of Xarelto  20 mg daily.   Today, he denies symptoms of orthopnea, PND, lower extremity edema, dizziness, presyncope, syncope, snoring, daytime somnolence, bleeding, or neurologic sequela. The patient is tolerating medications without difficulties and is otherwise without complaint today.    Atrial Fibrillation Risk Factors:  he does have symptoms or diagnosis  of sleep apnea. he is compliant with CPAP therapy. he does not have a history of rheumatic fever. he does not have a history of alcohol use. he has a BMI of Body mass index is 39.79 kg/m.Aaron Aas Filed Weights   02/21/24 1118  Weight: (!) 136.8 kg      Family History  Problem Relation Age of Onset   Diabetes Father    Heart attack Brother 16   The patient does not have a history of early familial atrial fibrillation or other arrhythmias.   Atrial Fibrillation Management history:  Previous antiarrhythmic drugs: amiodarone  Previous cardioversions: 06/19/18, 06/30/18, 09/20/23, 10/31/23 Previous ablations: 09/12/18, 01/23/24 CHADS2VASC score: 2 Anticoagulation history: Xarelto    Past Medical History:  Diagnosis Date   A-fib [facility])    ablation   Coronary artery disease    Dyslipidemia    Essential hypertension, benign    GERD (gastroesophageal reflux disease)    Gout, unspecified    History of kidney stones 2018   Morbid obesity (HCC)    OSA on CPAP    Past Surgical History:  Procedure Laterality Date   ATRIAL FIBRILLATION ABLATION N/A 09/12/2018   Procedure: ATRIAL FIBRILLATION ABLATION;  Surgeon: Lei Pump, MD;  [facility] INV[facility] CV LAB;  Service: Cardiovascular;  Laterality: N/A;   ATRIAL FIBRILLATION ABLATION N/A 01/23/2024   Procedure: ATRIAL FIBRILLATION ABLATION;  Surgeon: Lei Pump, MD;  Location: [facility] INVASIVE CV LAB;  Service: Cardiovascular;  Laterality: N/A;   CARDIOVERSION  06/30/2018   CARDIOVERSION N/A 09/20/2023  Procedure: CARDIOVERSION;  Surgeon: Elmyra Haggard, MD;  Location: [facility] INVASIVE CV LAB;  Service: Cardiovascular;  Laterality: N/A;   CARDIOVERSION N/A 10/31/2023   Procedure: CARDIOVERSION;  Surgeon: Hugh Madura, MD;  Location: [facility] INVASIVE CV LAB;  Service: Cardiovascular;  Laterality: N/A;   CYSTOSCOPY WITH HOLMIUM LASER LITHOTRIPSY  2018   x2 with stent   SEPTOPLASTY  1990   TONSILLECTOMY  2015   TOTAL KNEE ARTHROPLASTY Left  07/20/2019   Procedure: TOTAL KNEE ARTHROPLASTY;  Surgeon: Liliane Rei, MD;  Location: [facility];  Service: Orthopedics;  Laterality: Left;    TOTAL KNEE ARTHROPLASTY Right 07/17/2021   Procedure: TOTAL KNEE ARTHROPLASTY;  Surgeon: Liliane Rei, MD;  Location: [facility];  Service: Orthopedics;  Laterality: Right;    Current Outpatient Medications  Medication Sig Dispense Refill   acetaminophen  (TYLENOL ) 500 MG tablet Take 1,000 mg by mouth every 6 (six) hours as needed for mild pain or headache. (Patient taking differently: Take 1,000 mg by mouth as needed for mild pain (pain score 1-3) or headache.)     atenolol  (TENORMIN ) 50 MG tablet TAKE 1 TABLET(50 MG) BY MOUTH DAILY 90 tablet 3   atorvastatin  (LIPITOR) 40 MG tablet Take 1 tablet (40 mg total) by mouth daily. 90 tablet 3   benazepril  (LOTENSIN ) 20 MG tablet Take 20 mg by mouth daily.     cetirizine (ZYRTEC) 10 MG tablet Take 10 mg by mouth daily.     diclofenac Sodium (VOLTAREN ARTHRITIS PAIN) 1 % GEL Apply 2 g topically daily as needed (Knee pain).     fenofibrate  160 MG tablet Take 160 mg by mouth daily.     hydrochlorothiazide  (HYDRODIURIL ) 25 MG tablet Take 12.5 mg by mouth daily.     loperamide (IMODIUM A-D) 2 MG tablet Take 2-4 mg by mouth as needed for diarrhea or loose stools.     Naphazoline HCl (CLEAR EYES OP) Place 1 drop into both eyes daily.     Omega-3 Fatty Acids (FISH OIL) 1000 MG CAPS Take 1 capsule by mouth daily.     omeprazole (PRILOSEC) 20 MG capsule Take 20 mg by mouth daily.     rivaroxaban  (XARELTO ) 20 MG TABS tablet Take 1 tablet (20 mg total) by mouth daily with supper. 30 tablet 5   amiodarone  (PACERONE ) 200 MG tablet Take 1 tablet (200 mg total) by mouth daily. 90 tablet 1   diltiazem  (CARDIZEM ) 30 MG tablet Take 1 tablet (30 mg total) by mouth 2 (two) times daily. (Patient not taking: Reported on 02/21/2024) 60 tablet 3   Semaglutide-Weight Management 0.25 MG/0.5ML SOAJ Inject 0.25 mg into the skin every  Friday. (Patient not taking: Reported on 02/21/2024)     No current facility-administered medications for this encounter.    No Known Allergies  ROS- All systems are reviewed and negative except as per the HPI above.  Physical Exam: Vitals:   02/21/24 1118  BP: (!) 160/86  Pulse: 67  Weight: (!) 136.8 kg  Height: 6' 1 (1.854 m)    GEN- The patient is well appearing, alert and oriented x 3 today.   Neck - no JVD or carotid bruit noted Lungs- Clear to ausculation bilaterally, normal work of breathing Heart- Regular rate and rhythm, no murmurs, rubs or gallops, PMI not laterally displaced Extremities- no clubbing, cyanosis, or edema Skin - no rash or ecchymosis noted   Wt Readings from Last 3 Encounters:  02/21/24 (!) 136.8 kg  01/23/24 132 kg  10/21/23 133.8  kg    EKG today demonstrates  Vent. rate 67 BPM PR interval 172 ms QRS duration 78 ms QT/QTcB 428/452 ms P-R-T axes 42 50 8 Sinus rhythm with Premature supraventricular complexes Otherwise normal ECG When compared with ECG of 23-Jan-2024 08:58, Premature supraventricular complexes are now Present  Echo 06/16/18 demonstrated LVEF 55-60%, mild LVH, mild MR, mild aortic root dilation (43mm), LA normal size.  Epic records are reviewed at length today  CHA2DS2-VASc Score = 2  The patient's score is based upon: CHF History: 0 HTN History: 1 Diabetes History: 0 Stroke History: 0 Vascular Disease History: 1 Age Score: 0 Gender Score: 0      Assessment and Plan:  1. Persistent atrial fibrillation S/p afib ablation by Dr Lawana Pray 09/12/18.  S/p successful DCCV on 09/20/23.  S/p DCCV on 10/31/23. S/p Afib ablation on 01/23/24 by Dr. Lawana Pray.  He is currently in NSR. Continue atenolol  50 mg daily.   High risk medication monitoring (ICD10: [id number]) Patient requires ongoing monitoring for anti-arrhythmic medication which has the potential to cause life threatening arrhythmias or AV block. Qtc stable. Reduce dose  to amiodarone  200 mg once daily.  2. Secondary hypercoagulable state due to atrial fibrillation Continue Xarelto  20 mg daily without interruption.   3. HTN Elevated today but he has not taken any of his BP meds this morning. He notes BP at home is 130s systolic. Continue to monitor.   4. Obstructive sleep apnea He uses his CPAP regularly.  5. Obesity Body mass index is 39.79 kg/m. Exercise encouraged.    Follow up with Afib clinic as scheduled.   Woody Heading, PA-C Afib Clinic [facility] [address] [place], [place] [address] [phone]

## 2024-02-21 NOTE — Patient Instructions (Signed)
Decrease Amiodarone 200mg once a day 

## 2024-04-14 ENCOUNTER — Other Ambulatory Visit: Payer: Self-pay | Admitting: Physician Assistant

## 2024-04-23 ENCOUNTER — Ambulatory Visit (HOSPITAL_COMMUNITY): Admitting: Internal Medicine

## 2024-04-24 ENCOUNTER — Ambulatory Visit (HOSPITAL_COMMUNITY)
Admission: RE | Admit: 2024-04-24 | Discharge: 2024-04-24 | Disposition: A | Discharge status: Home / Self Care | Source: Ambulatory Visit | Attending: Internal Medicine | Admitting: Internal Medicine

## 2024-04-24 VITALS — BP 154/92 | HR 72 | Ht 73.0 in | Wt 313.2 lb

## 2024-04-24 DIAGNOSIS — D6869 Other thrombophilia: Secondary | ICD-10-CM | POA: Diagnosis not present

## 2024-04-24 DIAGNOSIS — I4819 Other persistent atrial fibrillation: Secondary | ICD-10-CM

## 2024-04-24 DIAGNOSIS — I4891 Unspecified atrial fibrillation: Secondary | ICD-10-CM

## 2024-04-24 NOTE — Patient Instructions (Signed)
 Stop amiodarone

## 2024-04-24 NOTE — Progress Notes (Signed)
 Primary Care Physician: Fuller Lorrene PARAS, MD Primary Cardiologist: Dr Luane Primary Electrophysiologist: Dr Inocencio Referring Physician: Dr Inocencio Senior Sascha Baugher is a 63 y.o. male with a history of persistent atrial fibrillation, CAD (CAC score of 636 in 2020), OSA, HTN, HLD who presents for consultation in the [facility] Atrial Fibrillation [facility].  The patient was initially diagnosed with atrial fibrillation 05/2018 at his PCP after presenting with symptoms of fatigue and exertional dyspnea. S/p DCCV on 06/19/18 and 06/30/18 with early return of afib. Now s/p afib ablation by Dr Inocencio 09/12/18.   On evaluation today, he is currently in Afib. He contacted office on 12/9 noting he has been in Afib for the past couple of days. He notes acute stress due to wife having third surgery related to breast cancer and daughter going through divorce. He notes his HR was in the 130s this morning. He wears an Apple watch. Patient is not on anticoagulation.  On follow up 08/30/23, he is currently in Afib. He is on Xarelto  20 mg since last OV and missed a dose on 12/21. He feels tired and SOB when in Afib.   On follow up 10/04/23, he is currently in Afib. S/p successful DCCV on 09/20/23. He went back into Afib last Saturday 1/25. He feels tired when in Afib.   On follow up 02/21/24, patient is currently in NSR. S/p Afib ablation on 01/23/24 by Dr. Inocencio. No episodes of Afib since ablation. He is taking amiodarone  200 mg twice daily; he has been doing this since April. No chest pain or SOB. Leg sites healed without issue. No missed doses of Xarelto  20 mg daily.   On follow up 04/24/24, patient is currently in NSR. He has maintained normal rhythm since last office visit. He is taking amiodarone  200 mg once daily. No bleeding issues on Xarelto .   Today, he denies symptoms of orthopnea, PND, lower extremity edema, dizziness, presyncope, syncope, snoring, daytime somnolence, bleeding, or neurologic  sequela. The patient is tolerating medications without difficulties and is otherwise without complaint today.    Atrial Fibrillation Risk Factors:  he does have symptoms or diagnosis of sleep apnea. he is compliant with CPAP therapy. he does not have a history of rheumatic fever. he does not have a history of alcohol use. he has a BMI of Body mass index is 41.32 kg/m.Austin Barker Filed Weights   04/24/24 1110  Weight: (!) 142.1 kg     Family History  Problem Relation Age of Onset   Diabetes Father    Heart attack Brother 80   The patient does not have a history of early familial atrial fibrillation or other arrhythmias.   Atrial Fibrillation Management history:  Previous antiarrhythmic drugs: amiodarone  Previous cardioversions: 06/19/18, 06/30/18, 09/20/23, 10/31/23 Previous ablations: 09/12/18, 01/23/24 CHADS2VASC score: 2 Anticoagulation history: Xarelto    Past Medical History:  Diagnosis Date   A-fib [facility])    ablation   Coronary artery disease    Dyslipidemia    Essential hypertension, benign    GERD (gastroesophageal reflux disease)    Gout, unspecified    History of kidney stones 2018   Morbid obesity (HCC)    OSA on CPAP    Past Surgical History:  Procedure Laterality Date   ATRIAL FIBRILLATION ABLATION N/A 09/12/2018   Procedure: ATRIAL FIBRILLATION ABLATION;  Surgeon: Inocencio Soyla Lunger, MD;  Location: [facility] INVAS[facility] CV LAB;  Service: Cardiovascular;  Laterality: N/A;   ATRIAL FIBRILLATION ABLATION N/A 01/23/2024   Procedure: ATRIAL  FIBRILLATION ABLATION;  Surgeon: Inocencio Soyla Lunger, MD;  Location: [facility] INVASIVE CV LAB;  Service: Cardiovascular;  Laterality: N/A;   CARDIOVERSION  06/30/2018   CARDIOVERSION N/A 09/20/2023   Procedure: CARDIOVERSION;  Surgeon: Okey Vina GAILS, MD;  Location: [facility] INVASIVE CV LAB;  Service: Cardiovascular;  Laterality: N/A;   CARDIOVERSION N/A 10/31/2023   Procedure: CARDIOVERSION;  Surgeon: Jeffrie Oneil BROCKS, MD;  Location: [facility] INVASIVE CV LAB;   Service: Cardiovascular;  Laterality: N/A;   CYSTOSCOPY WITH HOLMIUM LASER LITHOTRIPSY  2018   x2 with stent   SEPTOPLASTY  1990   TONSILLECTOMY  2015   TOTAL KNEE ARTHROPLASTY Left 07/20/2019   Procedure: TOTAL KNEE ARTHROPLASTY;  Surgeon: Melodi Lerner, MD;  Location: [facility];  Service: Orthopedics;  Laterality: Left;    TOTAL KNEE ARTHROPLASTY Right 07/17/2021   Procedure: TOTAL KNEE ARTHROPLASTY;  Surgeon: Melodi Lerner, MD;  Location: [facility];  Service: Orthopedics;  Laterality: Right;    Current Outpatient Medications  Medication Sig Dispense Refill   acetaminophen  (TYLENOL ) 500 MG tablet Take 1,000 mg by mouth every 6 (six) hours as needed for mild pain or headache. (Patient taking differently: Take 1,000 mg by mouth as needed for mild pain (pain score 1-3) or headache.)     amiodarone  (PACERONE ) 200 MG tablet Take 1 tablet (200 mg total) by mouth daily. 90 tablet 1   atenolol  (TENORMIN ) 50 MG tablet TAKE 1 TABLET(50 MG) BY MOUTH DAILY 90 tablet 1   atorvastatin  (LIPITOR) 40 MG tablet Take 1 tablet (40 mg total) by mouth daily. 90 tablet 3   benazepril  (LOTENSIN ) 20 MG tablet Take 20 mg by mouth daily.     cetirizine (ZYRTEC) 10 MG tablet Take 10 mg by mouth daily.     diclofenac Sodium (VOLTAREN ARTHRITIS PAIN) 1 % GEL Apply 2 g topically daily as needed (Knee pain).     fenofibrate  160 MG tablet Take 160 mg by mouth daily.     hydrochlorothiazide  (HYDRODIURIL ) 25 MG tablet Take 12.5 mg by mouth daily.     loperamide (IMODIUM A-D) 2 MG tablet Take 2-4 mg by mouth as needed for diarrhea or loose stools.     Naphazoline HCl (CLEAR EYES OP) Place 1 drop into both eyes daily.     Omega-3 Fatty Acids (FISH OIL) 1000 MG CAPS Take 1 capsule by mouth daily.     omeprazole (PRILOSEC) 20 MG capsule Take 20 mg by mouth daily.     rivaroxaban  (XARELTO ) 20 MG TABS tablet Take 1 tablet (20 mg total) by mouth daily with supper. 30 tablet 5   Semaglutide-Weight Management 0.25 MG/0.5ML SOAJ  Inject 0.25 mg into the skin every Friday.     diltiazem  (CARDIZEM ) 30 MG tablet Take 1 tablet (30 mg total) by mouth 2 (two) times daily. (Patient not taking: Reported on 04/24/2024) 60 tablet 3   No current facility-administered medications for this encounter.    No Known Allergies  ROS- All systems are reviewed and negative except as per the HPI above.  Physical Exam: Vitals:   04/24/24 1110  BP: (!) 154/92  Pulse: 72  Weight: (!) 142.1 kg  Height: 6' 1 (1.854 m)    GEN- The patient is well appearing, alert and oriented x 3 today.   Neck - no JVD or carotid bruit noted Lungs- Clear to ausculation bilaterally, normal work of breathing Heart- Regular rate and rhythm, no murmurs, rubs or gallops, PMI not laterally displaced Extremities- no clubbing, cyanosis, or edema Skin -  no rash or ecchymosis noted   Wt Readings from Last 3 Encounters:  04/24/24 (!) 142.1 kg  02/21/24 (!) 136.8 kg  01/23/24 132 kg    EKG today demonstrates  Vent. rate 72 BPM PR interval 170 ms QRS duration 82 ms QT/QTcB 418/457 ms P-R-T axes 32 43 20 Normal sinus rhythm with sinus arrhythmia Normal ECG When compared with ECG of 21-Feb-2024 11:24, Premature supraventricular complexes are no longer Present  Echo 06/16/18 demonstrated LVEF 55-60%, mild LVH, mild MR, mild aortic root dilation (43mm), LA normal size.  Epic records are reviewed at length today  CHA2DS2-VASc Score = 2  The patient's score is based upon: CHF History: 0 HTN History: 1 Diabetes History: 0 Stroke History: 0 Vascular Disease History: 1 Age Score: 0 Gender Score: 0      Assessment and Plan:  1. Persistent atrial fibrillation S/p afib ablation by Dr Inocencio 09/12/18.  S/p successful DCCV on 09/20/23.  S/p DCCV on 10/31/23. S/p Afib ablation on 01/23/24 by Dr. Inocencio.  He is currently in NSR. Continue atenolol  50 mg daily. He is now 3 months post procedure. Will discontinue amiodarone  today.  2. Secondary  hypercoagulable state due to atrial fibrillation Continue Xarelto .  3. HTN Elevated today, patient believes he is taking half tablet of hydrochlorothiazide  and benazepril . Recommended to patient to trend BP at home and if remains elevated to consider resuming one tablet of benazepril . Advised to call clinic if he does this to order labs.   4. Obstructive sleep apnea He uses his CPAP regularly.  5. Obesity Body mass index is 41.32 kg/m. Exercise encouraged.     Follow up 6 months with Dr. Inocencio.    Dorn Heinrich, PA-C Afib Clinic [facility] [address] [place], [place] [address] [phone]

## 2024-07-11 ENCOUNTER — Other Ambulatory Visit: Payer: Self-pay | Admitting: Cardiology

## 2024-07-11 DIAGNOSIS — I480 Paroxysmal atrial fibrillation: Secondary | ICD-10-CM

## 2024-07-13 NOTE — Telephone Encounter (Signed)
 Xarelto  20mg  refill request received. Pt is 63 years old, weight-142.1kg, Crea-1.36 on 01/23/24, last seen by Fairy Heinrich on 04/24/24, Diagnosis-Afib, CrCl-110.29 mL/min; Dose is appropriate based on dosing criteria. Will send in refill to requested pharmacy.

## 2024-09-24 ENCOUNTER — Telehealth: Payer: Self-pay | Admitting: Cardiology

## 2024-09-24 NOTE — Telephone Encounter (Signed)
" °*  STAT* If patient is at the pharmacy, call can be transferred to refill team.   1. Which medications need to be refilled? (please list name of each medication and dose if known) atorvastatin  (LIPITOR) 40 MG tablet   fenofibrate  160 MG tablet    2. Would you like to learn more about the convenience, safety, & potential cost savings by using the [facility]? No      3. Are you open to using the [facility] (Type [facility]. No    4. Which pharmacy/location (including street and city if local pharmacy) is medication to be sent to?[id number] - [place], [place] - [address]    5. Do they need a 30 day or 90 day supply? 90 day   Pt scheduled 2/24   "

## 2024-09-29 NOTE — Telephone Encounter (Signed)
 Pt has been made aware to reach out to his PCP regarding his cholesterol medications, as he doesn't see a Gen Cards and we haven't been filling them.  He was thankful and remembered that  I was absolutely correct and stated he will reach out to them, and thanked me for the call.

## 2024-10-27 ENCOUNTER — Ambulatory Visit: Admitting: Physician Assistant
